# Patient Record
Sex: Female | Born: 1945 | Race: White | Hispanic: No | Smoking: Current every day smoker
Health system: Southern US, Community
[De-identification: ages and names within clinical notes are randomized; demographics above are authoritative.]

## PROBLEM LIST (undated history)

## (undated) DIAGNOSIS — E7800 Pure hypercholesterolemia, unspecified: Secondary | ICD-10-CM

## (undated) DIAGNOSIS — R51 Headache: Secondary | ICD-10-CM

## (undated) DIAGNOSIS — F17200 Nicotine dependence, unspecified, uncomplicated: Secondary | ICD-10-CM

## (undated) DIAGNOSIS — J309 Allergic rhinitis, unspecified: Secondary | ICD-10-CM

## (undated) DIAGNOSIS — G43909 Migraine, unspecified, not intractable, without status migrainosus: Secondary | ICD-10-CM

## (undated) DIAGNOSIS — F1910 Other psychoactive substance abuse, uncomplicated: Secondary | ICD-10-CM

## (undated) DIAGNOSIS — J189 Pneumonia, unspecified organism: Secondary | ICD-10-CM

## (undated) DIAGNOSIS — K259 Gastric ulcer, unspecified as acute or chronic, without hemorrhage or perforation: Secondary | ICD-10-CM

## (undated) DIAGNOSIS — I059 Rheumatic mitral valve disease, unspecified: Secondary | ICD-10-CM

## (undated) DIAGNOSIS — C801 Malignant (primary) neoplasm, unspecified: Secondary | ICD-10-CM

## (undated) DIAGNOSIS — M1710 Unilateral primary osteoarthritis, unspecified knee: Secondary | ICD-10-CM

## (undated) DIAGNOSIS — R011 Cardiac murmur, unspecified: Secondary | ICD-10-CM

## (undated) DIAGNOSIS — F32A Depression, unspecified: Secondary | ICD-10-CM

## (undated) DIAGNOSIS — D239 Other benign neoplasm of skin, unspecified: Secondary | ICD-10-CM

## (undated) DIAGNOSIS — R7309 Other abnormal glucose: Secondary | ICD-10-CM

## (undated) DIAGNOSIS — C679 Malignant neoplasm of bladder, unspecified: Secondary | ICD-10-CM

## (undated) DIAGNOSIS — IMO0002 Reserved for concepts with insufficient information to code with codable children: Secondary | ICD-10-CM

## (undated) DIAGNOSIS — F329 Major depressive disorder, single episode, unspecified: Secondary | ICD-10-CM

## (undated) DIAGNOSIS — I10 Essential (primary) hypertension: Secondary | ICD-10-CM

## (undated) DIAGNOSIS — T7840XA Allergy, unspecified, initial encounter: Secondary | ICD-10-CM

## (undated) DIAGNOSIS — N951 Menopausal and female climacteric states: Secondary | ICD-10-CM

## (undated) HISTORY — DX: Rheumatic mitral valve disease, unspecified: I05.9

## (undated) HISTORY — DX: Reserved for concepts with insufficient information to code with codable children: IMO0002

## (undated) HISTORY — DX: Unilateral primary osteoarthritis, unspecified knee: M17.10

## (undated) HISTORY — DX: Allergic rhinitis, unspecified: J30.9

## (undated) HISTORY — DX: Major depressive disorder, single episode, unspecified: F32.9

## (undated) HISTORY — DX: Depression, unspecified: F32.A

## (undated) HISTORY — DX: Malignant (primary) neoplasm, unspecified: C80.1

## (undated) HISTORY — DX: Pure hypercholesterolemia, unspecified: E78.00

## (undated) HISTORY — DX: Menopausal and female climacteric states: N95.1

## (undated) HISTORY — DX: Other benign neoplasm of skin, unspecified: D23.9

## (undated) HISTORY — DX: Essential (primary) hypertension: I10

## (undated) HISTORY — DX: Other abnormal glucose: R73.09

## (undated) HISTORY — DX: Allergy, unspecified, initial encounter: T78.40XA

## (undated) HISTORY — DX: Gastric ulcer, unspecified as acute or chronic, without hemorrhage or perforation: K25.9

## (undated) HISTORY — DX: Headache: R51

## (undated) HISTORY — DX: Other psychoactive substance abuse, uncomplicated: F19.10

## (undated) HISTORY — DX: Nicotine dependence, unspecified, uncomplicated: F17.200

## (undated) HISTORY — DX: Migraine, unspecified, not intractable, without status migrainosus: G43.909

---
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
## 1968-11-02 HISTORY — PX: BREAST BIOPSY: SHX20

## 1996-11-02 HISTORY — PX: DILATION AND CURETTAGE OF UTERUS: SHX78

## 2007-10-13 ENCOUNTER — Ambulatory Visit: Payer: Self-pay | Admitting: Family Medicine

## 2007-10-19 ENCOUNTER — Ambulatory Visit: Payer: Self-pay | Admitting: Family Medicine

## 2008-08-20 ENCOUNTER — Emergency Department: Payer: Self-pay | Admitting: Emergency Medicine

## 2008-10-02 HISTORY — PX: CARDIAC CATHETERIZATION: SHX172

## 2010-07-15 ENCOUNTER — Ambulatory Visit: Payer: Self-pay | Admitting: Family Medicine

## 2011-01-08 ENCOUNTER — Ambulatory Visit: Payer: Self-pay | Admitting: Family Medicine

## 2011-07-13 ENCOUNTER — Ambulatory Visit: Payer: Self-pay | Admitting: Family Medicine

## 2011-07-14 ENCOUNTER — Ambulatory Visit: Payer: Self-pay | Admitting: Family Medicine

## 2011-07-14 LAB — HM MAMMOGRAPHY: HM Mammogram: NEGATIVE

## 2011-11-03 HISTORY — PX: OTHER SURGICAL HISTORY: SHX169

## 2012-08-19 ENCOUNTER — Encounter: Payer: Self-pay | Admitting: *Deleted

## 2012-08-23 ENCOUNTER — Encounter: Payer: Self-pay | Admitting: Family Medicine

## 2012-08-29 ENCOUNTER — Ambulatory Visit (INDEPENDENT_AMBULATORY_CARE_PROVIDER_SITE_OTHER): Payer: BC Managed Care – PPO | Admitting: Family Medicine

## 2012-08-29 ENCOUNTER — Encounter: Payer: Self-pay | Admitting: Family Medicine

## 2012-08-29 VITALS — BP 148/88 | HR 71 | Temp 98.4°F | Resp 16 | Ht 65.5 in | Wt 151.0 lb

## 2012-08-29 DIAGNOSIS — Z01419 Encounter for gynecological examination (general) (routine) without abnormal findings: Secondary | ICD-10-CM

## 2012-08-29 DIAGNOSIS — E7800 Pure hypercholesterolemia, unspecified: Secondary | ICD-10-CM

## 2012-08-29 DIAGNOSIS — R7309 Other abnormal glucose: Secondary | ICD-10-CM

## 2012-08-29 DIAGNOSIS — Z0000 Encounter for general adult medical examination without abnormal findings: Secondary | ICD-10-CM

## 2012-08-29 DIAGNOSIS — I10 Essential (primary) hypertension: Secondary | ICD-10-CM

## 2012-08-29 DIAGNOSIS — M169 Osteoarthritis of hip, unspecified: Secondary | ICD-10-CM

## 2012-08-29 LAB — POCT UA - MICROSCOPIC ONLY
Casts, Ur, LPF, POC: NEGATIVE
Yeast, UA: NEGATIVE

## 2012-08-29 LAB — POCT URINALYSIS DIPSTICK
Blood, UA: NEGATIVE
Glucose, UA: NEGATIVE
Nitrite, UA: NEGATIVE
Protein, UA: NEGATIVE
Urobilinogen, UA: 0.2

## 2012-08-29 NOTE — Progress Notes (Signed)
[address]   [place], [place]  [address]   [phone]  Subjective:    Patient ID: Kelly Merritt, female    DOB: 09-Jul-1946, 66 y.o.   MRN: [id number]  HPI This 66 y.o. female presents to establish care and for evaluation of CPE. Last physical 07/2011.  Pap smear 07/2011.  Mammogram 07/14/2011.  Colonoscopy never; refuses.  TDAP 2012.  Pneumovax 2010.  Zostavax never; refuses; history of Zoster.  Influenza vaccine never; refuses.  Eye exam 2010; glasses.  Dental exam every six months.      Review of Systems  Constitutional: Negative for fever, chills, diaphoresis, activity change, appetite change, fatigue and unexpected weight change.  HENT: Negative for hearing loss, ear pain, nosebleeds, congestion, sore throat, facial swelling, rhinorrhea, sneezing, drooling, mouth sores, trouble swallowing, neck pain, neck stiffness, dental problem, voice change, postnasal drip, sinus pressure, tinnitus and ear discharge.   Eyes: Negative for photophobia, pain, discharge, redness, itching and visual disturbance.  Respiratory: Negative for apnea, cough, choking, chest tightness, shortness of breath, wheezing and stridor.   Cardiovascular: Negative for chest pain, palpitations and leg swelling.  Gastrointestinal: Negative for nausea, vomiting, abdominal pain, diarrhea, constipation, blood in stool, abdominal distention, anal bleeding and rectal pain.  Genitourinary: Negative for dysuria, urgency, frequency, hematuria, flank pain, decreased urine volume, vaginal bleeding, vaginal discharge, enuresis, difficulty urinating, genital sores, vaginal pain, menstrual problem, pelvic pain and dyspareunia.  Musculoskeletal: Positive for arthralgias and gait problem. Negative for myalgias, back pain and joint swelling.  Skin: Negative for color change, pallor, rash and wound.  Neurological: Negative for dizziness, tremors, seizures, syncope, facial asymmetry, speech difficulty, weakness, light-headedness, numbness  and headaches.  Hematological: Negative for adenopathy. Does not bruise/bleed easily.  Psychiatric/Behavioral: Negative for suicidal ideas, hallucinations, behavioral problems, confusion, disturbed wake/sleep cycle, self-injury, dysphoric mood, decreased concentration and agitation. The patient is not nervous/anxious and is not hyperactive.         Past Medical History  Diagnosis Date  . Headache   . Migraine, unspecified, without mention of intractable migraine without mention of status migrainosus   . Gastric ulcer, unspecified as acute or chronic, without mention of hemorrhage, perforation, or obstruction   . Symptomatic menopausal or female climacteric states   . Tobacco use disorder   . Symptomatic menopausal or female climacteric states   . Mitral valve disorders   . Osteoarthrosis, unspecified whether generalized or localized, lower leg   . Pure hypercholesterolemia   . Benign neoplasm of other specified sites of skin   . Allergic rhinitis, cause unspecified   . Essential hypertension, benign   . Other abnormal glucose     Past Surgical History  Procedure Date  . Tonsillectomy and adenoidectomy 1950  . Breast biopsy 1970  . Dilation and curettage of uterus 1998    heavy menses  . Cardiac catheterization 10/2008  . Resection of squamous cell carcinoma forearm 2013    Prior to Admission medications   Medication Sig Start Date End Date Taking? Authorizing Provider  amLODipine (NORVASC) 5 MG tablet Take 5 mg by mouth daily.   Yes Historical Provider, MD  calcium carbonate (OS-CAL) 600 MG TABS Take 600 mg by mouth 2 (two) times daily with a meal.   Yes Historical Provider, MD  Coenzyme Q10 (COQ10 PO) Take by mouth daily.   Yes Historical Provider, MD  Glucosamine-Chondroitin 500-400 MG CAPS Take by mouth.   Yes Historical Provider, MD  ibuprofen (ADVIL,MOTRIN) 800 MG tablet Take 800 mg by mouth  every 8 (eight) hours as needed.   Yes Historical Provider, MD  lactobacillus  acidophilus (BACID) TABS Take by mouth.   Yes Historical Provider, MD  Multiple Vitamin (MULTIVITAMINS PO) Take by mouth daily.   Yes Historical Provider, MD  OMEGA 3 1000 MG CAPS Take 2 capsules by mouth daily.   Yes Historical Provider, MD  vitamin C (ASCORBIC ACID) 250 MG tablet Take 250 mg by mouth daily.   Yes Historical Provider, MD  vitamin E 400 UNIT capsule Take 400 Units by mouth daily.   Yes Historical Provider, MD  cholecalciferol (VITAMIN D) 400 UNITS TABS Take 400 Units by mouth daily.    Historical Provider, MD  Cholecalciferol 1000 UNITS capsule Take 1,000 Units by mouth daily.    Historical Provider, MD  Ginkgo Biloba (GINKOBA) 40 MG TABS Take by mouth daily.    Historical Provider, MD  metoprolol succinate (TOPROL-XL) 25 MG 24 hr tablet Take 25 mg by mouth daily.    Historical Provider, MD    Allergies  Allergen Reactions  . Indomethacin Other (See Comments)    SEVERE STOMACH CRAMPS AND BLOOD IN URINE    History   Social History  . Marital Status: Married    Spouse Name: N/A    Number of Children: 3  . Years of Education: college   Occupational History  . retired     Runner, broadcasting/film/video   Social History Main Topics  . Smoking status: Current Every Day Smoker -- 30 years    Types: Cigarettes  . Smokeless tobacco: Not on file   Comment: LESS THAN 1/2 PACK  . Alcohol Use: Yes     minimal  . Drug Use: No  . Sexually Active: Not on file   Other Topics Concern  . Not on file   Social History Narrative   Always uses seat belts. Smoke alarm and carbon monoxide detector in the home. Exercise: pt exercises fairly.Married x 39 years. Moderately happy, verbal abuse only at times, denies physical abuse, 3 children, 3 grandchildren.    Family History  Problem Relation Age of Onset  . Alcohol abuse Father   . Drug abuse Father   . Depression Father   . Cirrhosis Father   . Gout Father   . CAD Father     PTSD  . Heart disease Brother   . Dementia Mother   . Cancer  Sister     lung  . Hypertension Sister   . Hyperlipidemia Sister   . Leukemia Sister   . Heart disease Brother   . Hyperlipidemia Brother   . Hypertension Brother   . Ovarian cancer    . Breast cancer      Objective:   Physical Exam  Nursing note and vitals reviewed. Constitutional: She is oriented to person, place, and time. She appears well-developed and well-nourished. No distress.  HENT:  Head: Normocephalic and atraumatic.  Right Ear: External ear normal.  Left Ear: External ear normal.  Nose: Nose normal.  Mouth/Throat: Oropharynx is clear and moist.  Eyes: Conjunctivae normal and EOM are normal. Pupils are equal, round, and reactive to light.  Neck: Normal range of motion. Neck supple. No tracheal deviation present. No thyromegaly present.  Cardiovascular: Normal rate, regular rhythm, normal heart sounds and intact distal pulses.  Exam reveals no gallop and no friction rub.   No murmur heard. Pulmonary/Chest: Effort normal and breath sounds normal. No respiratory distress. She has no wheezes. She has no rales.  Abdominal: Soft. Bowel sounds  are normal. She exhibits no distension and no mass. There is no tenderness. There is no rebound and no guarding. Hernia confirmed negative in the right inguinal area and confirmed negative in the left inguinal area.  Genitourinary: Rectum normal, vagina normal and uterus normal. Rectal exam shows no fissure, no mass and anal tone normal. Guaiac negative stool. No breast swelling, tenderness, discharge or bleeding. There is no rash, tenderness or lesion on the right labia. There is no rash, tenderness or lesion on the left labia. Cervix exhibits no motion tenderness, no discharge and no friability. Right adnexum displays no mass, no tenderness and no fullness. Left adnexum displays no mass, no tenderness and no fullness. No vaginal discharge found.  Lymphadenopathy:    She has no cervical adenopathy.       Right: No inguinal adenopathy  present.       Left: No inguinal adenopathy present.  Neurological: She is alert and oriented to person, place, and time. She has normal reflexes. No cranial nerve deficit. She exhibits normal muscle tone. Coordination normal.  Skin: Skin is warm and dry. No rash noted. She is not diaphoretic. No erythema. No pallor.  Psychiatric: She has a normal mood and affect. Her behavior is normal. Judgment and thought content normal.   EKG:  NSR; mild inferior T wave changes.  No acute.    Assessment & Plan:   1. Pure hypercholesterolemia  Comprehensive metabolic panel, Lipid panel  2. Essential hypertension, benign  Comprehensive metabolic panel, Lipid panel, CK  3. Other abnormal glucose  CBC with Differential, Comprehensive metabolic panel, Hemoglobin A1c  4. Routine gynecological examination  MM Digital Screening, Pap IG w/ reflex to HPV when ASC-U  5. Routine general medical examination at a health care facility  POCT UA - Microscopic Only, POCT urinalysis dipstick, IFOBT POC (occult bld, rslt in office)

## 2012-08-30 ENCOUNTER — Encounter: Payer: Self-pay | Admitting: Family Medicine

## 2012-08-30 DIAGNOSIS — Z01419 Encounter for gynecological examination (general) (routine) without abnormal findings: Secondary | ICD-10-CM | POA: Insufficient documentation

## 2012-08-30 DIAGNOSIS — E7800 Pure hypercholesterolemia, unspecified: Secondary | ICD-10-CM | POA: Insufficient documentation

## 2012-08-30 DIAGNOSIS — R7309 Other abnormal glucose: Secondary | ICD-10-CM | POA: Insufficient documentation

## 2012-08-30 DIAGNOSIS — Z0000 Encounter for general adult medical examination without abnormal findings: Secondary | ICD-10-CM | POA: Insufficient documentation

## 2012-08-30 DIAGNOSIS — M169 Osteoarthritis of hip, unspecified: Secondary | ICD-10-CM | POA: Insufficient documentation

## 2012-08-30 DIAGNOSIS — I10 Essential (primary) hypertension: Secondary | ICD-10-CM | POA: Insufficient documentation

## 2012-08-30 LAB — COMPREHENSIVE METABOLIC PANEL
ALT: 18 U/L (ref 0–35)
AST: 27 U/L (ref 0–37)
Alkaline Phosphatase: 67 U/L (ref 39–117)
Sodium: 139 mEq/L (ref 135–145)
Total Bilirubin: 0.7 mg/dL (ref 0.3–1.2)
Total Protein: 6.8 g/dL (ref 6.0–8.3)

## 2012-08-30 LAB — CBC WITH DIFFERENTIAL/PLATELET
Basophils Relative: 1 % (ref 0–1)
Eosinophils Absolute: 0.1 10*3/uL (ref 0.0–0.7)
Lymphs Abs: 2.5 10*3/uL (ref 0.7–4.0)
MCH: 32.2 pg (ref 26.0–34.0)
MCHC: 35.7 g/dL (ref 30.0–36.0)
Neutro Abs: 6.8 10*3/uL (ref 1.7–7.7)
Neutrophils Relative %: 66 % (ref 43–77)
Platelets: 219 10*3/uL (ref 150–400)
RBC: 4.91 MIL/uL (ref 3.87–5.11)

## 2012-08-30 LAB — HEMOGLOBIN A1C
Hgb A1c MFr Bld: 5.5 % (ref <5.7)
Mean Plasma Glucose: 111 mg/dL (ref <117)

## 2012-08-30 LAB — LIPID PANEL
LDL Cholesterol: 205 mg/dL — ABNORMAL HIGH (ref 0–99)
VLDL: 20 mg/dL (ref 0–40)

## 2012-08-30 LAB — CK: Total CK: 127 U/L (ref 7–177)

## 2012-08-30 LAB — PAP IG W/ RFLX HPV ASCU

## 2012-08-30 NOTE — Assessment & Plan Note (Signed)
Anticipatory guidance --- weight loss, exercise, smoking cessation.  Pap smear obtained ;refer for mammogram.  Refuses colonoscopy; hemosure obtained.  Immunizations -- declined flu vaccine and Zostavax.  Obtain labs.

## 2012-08-30 NOTE — Assessment & Plan Note (Signed)
Pap smear obtained; refer for mammogram. 

## 2012-08-30 NOTE — Assessment & Plan Note (Signed)
Worsening; considering THR in upcoming six months; continue Ibuprofen qhs.  Having difficulties exercising due to hip pain.

## 2012-08-30 NOTE — Assessment & Plan Note (Signed)
Uncontrolled due to non-compliance with statins.  Obtain labs.

## 2012-08-30 NOTE — Assessment & Plan Note (Signed)
Moderately controlled with better home readings; no changes in management today; obtain labs.

## 2012-08-30 NOTE — Assessment & Plan Note (Signed)
Stable; obtain labs. 

## 2012-12-03 HISTORY — PX: JOINT REPLACEMENT: SHX530

## 2013-02-20 ENCOUNTER — Ambulatory Visit (INDEPENDENT_AMBULATORY_CARE_PROVIDER_SITE_OTHER): Payer: BC Managed Care – PPO | Admitting: Family Medicine

## 2013-02-20 ENCOUNTER — Encounter: Payer: Self-pay | Admitting: Family Medicine

## 2013-02-20 VITALS — BP 134/90 | HR 80 | Temp 97.7°F | Resp 16 | Ht 66.0 in | Wt 151.0 lb

## 2013-02-20 DIAGNOSIS — E7800 Pure hypercholesterolemia, unspecified: Secondary | ICD-10-CM

## 2013-02-20 DIAGNOSIS — M169 Osteoarthritis of hip, unspecified: Secondary | ICD-10-CM

## 2013-02-20 DIAGNOSIS — R7309 Other abnormal glucose: Secondary | ICD-10-CM

## 2013-02-20 DIAGNOSIS — H6120 Impacted cerumen, unspecified ear: Secondary | ICD-10-CM

## 2013-02-20 DIAGNOSIS — H6121 Impacted cerumen, right ear: Secondary | ICD-10-CM

## 2013-02-20 DIAGNOSIS — I10 Essential (primary) hypertension: Secondary | ICD-10-CM

## 2013-02-20 LAB — CBC WITH DIFFERENTIAL/PLATELET
Eosinophils Relative: 4 % (ref 0–5)
HCT: 43.9 % (ref 36.0–46.0)
Hemoglobin: 15.6 g/dL — ABNORMAL HIGH (ref 12.0–15.0)
Lymphocytes Relative: 26 % (ref 12–46)
Lymphs Abs: 1.5 10*3/uL (ref 0.7–4.0)
MCV: 91.3 fL (ref 78.0–100.0)
Monocytes Absolute: 0.6 10*3/uL (ref 0.1–1.0)
Monocytes Relative: 10 % (ref 3–12)
Neutro Abs: 3.5 10*3/uL (ref 1.7–7.7)
RBC: 4.81 MIL/uL (ref 3.87–5.11)
RDW: 13.9 % (ref 11.5–15.5)
WBC: 5.8 10*3/uL (ref 4.0–10.5)

## 2013-02-20 LAB — HEMOGLOBIN A1C: Hgb A1c MFr Bld: 5.3 % (ref <5.7)

## 2013-02-20 LAB — LIPID PANEL
Cholesterol: 286 mg/dL — ABNORMAL HIGH (ref 0–200)
LDL Cholesterol: 185 mg/dL — ABNORMAL HIGH (ref 0–99)
Triglycerides: 115 mg/dL (ref <150)

## 2013-02-20 LAB — COMPREHENSIVE METABOLIC PANEL
Albumin: 4.1 g/dL (ref 3.5–5.2)
BUN: 16 mg/dL (ref 6–23)
CO2: 26 mEq/L (ref 19–32)
Calcium: 9.6 mg/dL (ref 8.4–10.5)
Chloride: 106 mEq/L (ref 96–112)
Creat: 0.82 mg/dL (ref 0.50–1.10)
Potassium: 4.3 mEq/L (ref 3.5–5.3)

## 2013-02-20 MED ORDER — AMLODIPINE BESYLATE 5 MG PO TABS: 5.0000 mg | ORAL_TABLET | Freq: Every day | ORAL | Status: DC

## 2013-02-20 NOTE — Progress Notes (Signed)
[address]   [place], [place]  [address]   [phone]  Subjective:    Patient ID: Kelly Merritt, female    DOB: 1946-05-09, 67 y.o.   MRN: [id number]  HPI This 67 y.o. female presents for six month follow-up:  1.  L hip OA:  S/p THR in 12/2012.  S/p six week follow-up with surgeon; follow-up in one year.  Never had real hip pain; still a little numb in lateral thigh that extends to knee; numbness is fading.  Previous burning, tearing pain.  Taking Naproxen bid now.  Discharged with 100 oxycontin; suffered with severe nausea on medication; discharged quickly due to bad weather.  Vomited with pain medication.  Took Tylenol and ASA.   Having insomnia due to pain; took 1/2 oxycontin.   No PT warranted; performing home exercises prescribed.  Staying busy in yard and spring cleaning; very time consuming.  Son still lives with patient; son is traveling [employer] fundraiser.   2.  HTN:  Six month follow-up; no changes to management made at last visit.  Home BP readings running 130s/80-90s.  Reports good compliance with medication; good tolerance to medication; good symptom control.  Denies CP/palp/SOB/leg swelling. Denies HA, dizziness,focal weakness, paresthesias.  3.  Hyperlipidemia:  Uncontrolled; refuses to take statin due to arthralgias/myalgias. Interested in other treatment options.  No previous Zetia or Niacin.  No exercise in past three years.    4.  Glucose Intolerance:  Stable; has gained weight over past few years with less activity.  5.  R ear drainage/wax:  Suffering with drainage from R ear for several weeks; thinks it is wax draining. Using peroxide periodically.  No pain; no hearing loss.    Review of Systems  Constitutional: Negative for fever, chills, diaphoresis and fatigue.  HENT: Positive for ear discharge. Negative for hearing loss, ear pain, congestion, rhinorrhea, sneezing, postnasal drip and sinus pressure.   Respiratory: Negative for cough, shortness of breath, wheezing and stridor.    Cardiovascular: Negative for chest pain, palpitations and leg swelling.  Gastrointestinal: Negative for nausea, vomiting, abdominal pain, diarrhea and constipation.  Musculoskeletal: Positive for arthralgias.  Neurological: Negative for dizziness, tremors, seizures, syncope, facial asymmetry, speech difficulty, weakness, light-headedness, numbness and headaches.   Past Medical History  Diagnosis Date  . Headache   . Migraine, unspecified, without mention of intractable migraine without mention of status migrainosus   . Gastric ulcer, unspecified as acute or chronic, without mention of hemorrhage, perforation, or obstruction   . Symptomatic menopausal or female climacteric states   . Tobacco use disorder   . Symptomatic menopausal or female climacteric states   . Mitral valve disorders   . Pure hypercholesterolemia   . Benign neoplasm of other specified sites of skin   . Allergic rhinitis, cause unspecified   . Essential hypertension, benign   . Other abnormal glucose   . Osteoarthrosis, unspecified whether generalized or localized, lower leg     Severe OA hip.   Past Surgical History  Procedure Laterality Date  . Tonsillectomy and adenoidectomy  1950  . Breast biopsy  1970  . Dilation and curettage of uterus  1998    heavy menses  . Cardiac catheterization  10/2008  . Resection of squamous cell carcinoma forearm  2013  . Joint replacement  12/03/2012    L THR   Current Outpatient Prescriptions on File Prior to Visit  Medication Sig Dispense Refill  . cholecalciferol (VITAMIN D) 400 UNITS TABS Take 400 Units by mouth  daily.      . Glucosamine-Chondroitin 500-400 MG CAPS Take by mouth.      . lactobacillus acidophilus (BACID) TABS Take by mouth.      . Multiple Vitamin (MULTIVITAMINS PO) Take by mouth daily.      . OMEGA 3 1000 MG CAPS Take 2 capsules by mouth daily.      . vitamin C (ASCORBIC ACID) 250 MG tablet Take 250 mg by mouth daily.      . vitamin E 400 UNIT capsule  Take 400 Units by mouth daily.      . calcium carbonate (OS-CAL) 600 MG TABS Take 600 mg by mouth 2 (two) times daily with a meal.      . Coenzyme Q10 (COQ10 PO) Take by mouth daily.      Marland Kitchen ibuprofen (ADVIL,MOTRIN) 800 MG tablet Take 800 mg by mouth every 8 (eight) hours as needed.       No current facility-administered medications on file prior to visit.   History   Social History  . Marital Status: Married    Spouse Name: N/A    Number of Children: 3  . Years of Education: college   Occupational History  . retired     Runner, broadcasting/film/video   Social History Main Topics  . Smoking status: Current Every Day Smoker -- 30 years    Types: Cigarettes  . Smokeless tobacco: Never Used     Comment: LESS THAN 1/2 PACK  . Alcohol Use: No     Comment: minimal  . Drug Use: No  . Sexually Active: Yes    Birth Control/ Protection: Post-menopausal   Other Topics Concern  . Not on file   Social History Narrative   Always uses seat belts.   Smoke alarm and carbon monoxide detector in the home.    Exercise: pt exercises minimally.   Married x 39 years. Moderately happy, verbal abuse only at times, denies physical abuse.   Children:   3 children, 3 grandchildren.  Employment: retired from school system.  Tobacco abuse:  1/2 ppd.   Alcohol: weekends.  Lives: with husband.                  Objective:   Physical Exam  Nursing note and vitals reviewed. Constitutional: She is oriented to person, place, and time. She appears well-developed and well-nourished. No distress.  HENT:  Head: Normocephalic and atraumatic.  Left Ear: External ear normal.  Nose: Nose normal.  R EAR: CERUMEN IN CANAL; TM NOT VISUALIZED.  Neck: Normal range of motion. Neck supple. No thyromegaly present.  Cardiovascular: Normal rate, regular rhythm, normal heart sounds and intact distal pulses.  Exam reveals no gallop.   No murmur heard. Pulmonary/Chest: Effort normal and breath sounds normal. No respiratory distress. She has no  wheezes. She has no rales.  Abdominal: Soft. Bowel sounds are normal. She exhibits no distension. There is no tenderness. There is no rebound and no guarding.  Musculoskeletal:  Well healed incision L hip.  Lymphadenopathy:    She has no cervical adenopathy.  Neurological: She is alert and oriented to person, place, and time. No cranial nerve deficit. She exhibits normal muscle tone. Coordination normal.  Skin: She is not diaphoretic.  Psychiatric: She has a normal mood and affect. Her behavior is normal. Judgment and thought content normal.    R EAR IRRIGATED DURING VISIT; NORMAL TM R VISUALIZED.    Assessment & Plan:  Essential hypertension, benign - Plan: CBC with Differential, Comprehensive  metabolic panel  Pure hypercholesterolemia - Plan: Lipid panel  Other abnormal glucose - Plan: CBC with Differential, Comprehensive metabolic panel, Hemoglobin A1c  OA (osteoarthritis) of hip  Cerumen impaction, right   1.  HTN: controlled; obtain labs; continue current medications. Refill provided. 2.  Hyperlipidemia:  Uncontrolled; pt desires trial of short acting Niacin 500mg  qhs fro next six months.  If cannot tolerate Niacin, to call for Zetia rx. 3.  Glucose Intolerance: improved; obtain labs.  Continue with dietary modification. 4.  OA Hip L:  Improved; s/p L THR. 5.  R cerumen impaction:  New.  S/p irrigation during visit.  Meds ordered this encounter  Medications  . OVER THE COUNTER MEDICATION    Sig: Naproxen 325 mg taking  . amLODipine (NORVASC) 5 MG tablet    Sig: Take 1 tablet (5 mg total) by mouth daily.    Dispense:  90 tablet    Refill:  3

## 2013-02-20 NOTE — Patient Instructions (Signed)
1.  START NIACIN IMMEDIATE RELEASE 500MG  ONE AT BEDTIME. 2.  TAKE ASPIRIN 81MG  ONE TABLET ONE HOUR BEFORE TAKING NIACIN.

## 2013-02-20 NOTE — Progress Notes (Deleted)
  Subjective:    Patient ID: Kelly Merritt, female    DOB: [birth year], 67 y.o.   MRN: [id number]  HPI    Review of Systems  Constitutional: Positive for fatigue.  HENT: Positive for ear discharge.   Respiratory: Negative.   Cardiovascular: Negative.   Gastrointestinal: Negative.   Endocrine: Negative.   Genitourinary: Negative.   Musculoskeletal: Negative.   Skin: Negative.   Allergic/Immunologic: Negative.   Neurological: Negative.   Hematological: Negative.   Psychiatric/Behavioral: Positive for confusion.       Objective:   Physical Exam        Assessment & Plan:

## 2013-03-06 ENCOUNTER — Encounter: Payer: Self-pay | Admitting: Family Medicine

## 2013-08-07 ENCOUNTER — Encounter: Payer: Self-pay | Admitting: Family Medicine

## 2013-08-07 ENCOUNTER — Ambulatory Visit (INDEPENDENT_AMBULATORY_CARE_PROVIDER_SITE_OTHER): Payer: Medicare Other | Admitting: Family Medicine

## 2013-08-07 VITALS — BP 128/84 | HR 72 | Resp 16 | Ht 66.0 in | Wt 153.0 lb

## 2013-08-07 DIAGNOSIS — Z720 Tobacco use: Secondary | ICD-10-CM | POA: Insufficient documentation

## 2013-08-07 DIAGNOSIS — M169 Osteoarthritis of hip, unspecified: Secondary | ICD-10-CM

## 2013-08-07 DIAGNOSIS — I10 Essential (primary) hypertension: Secondary | ICD-10-CM

## 2013-08-07 DIAGNOSIS — E7800 Pure hypercholesterolemia, unspecified: Secondary | ICD-10-CM

## 2013-08-07 DIAGNOSIS — Z0000 Encounter for general adult medical examination without abnormal findings: Secondary | ICD-10-CM

## 2013-08-07 DIAGNOSIS — Z01419 Encounter for gynecological examination (general) (routine) without abnormal findings: Secondary | ICD-10-CM

## 2013-08-07 DIAGNOSIS — Z139 Encounter for screening, unspecified: Secondary | ICD-10-CM

## 2013-08-07 DIAGNOSIS — R7309 Other abnormal glucose: Secondary | ICD-10-CM

## 2013-08-07 LAB — POCT URINALYSIS DIPSTICK
Bilirubin, UA: NEGATIVE
Glucose, UA: NEGATIVE
Leukocytes, UA: NEGATIVE
Nitrite, UA: NEGATIVE
Protein, UA: NEGATIVE
Spec Grav, UA: 1.025
Urobilinogen, UA: 0.2
pH, UA: 5.5

## 2013-08-07 LAB — COMPREHENSIVE METABOLIC PANEL
ALT: 15 U/L (ref 0–35)
AST: 22 U/L (ref 0–37)
Albumin: 4.4 g/dL (ref 3.5–5.2)
Alkaline Phosphatase: 71 U/L (ref 39–117)
CO2: 29 mEq/L (ref 19–32)
Glucose, Bld: 99 mg/dL (ref 70–99)
Potassium: 4.1 mEq/L (ref 3.5–5.3)
Sodium: 137 mEq/L (ref 135–145)
Total Bilirubin: 0.7 mg/dL (ref 0.3–1.2)
Total Protein: 6.7 g/dL (ref 6.0–8.3)

## 2013-08-07 LAB — LIPID PANEL
Cholesterol: 326 mg/dL — ABNORMAL HIGH (ref 0–200)
LDL Cholesterol: 231 mg/dL — ABNORMAL HIGH (ref 0–99)
Total CHOL/HDL Ratio: 4.3 Ratio
Triglycerides: 95 mg/dL (ref <150)
VLDL: 19 mg/dL (ref 0–40)

## 2013-08-07 LAB — IFOBT (OCCULT BLOOD): IFOBT: NEGATIVE

## 2013-08-07 LAB — CBC WITH DIFFERENTIAL/PLATELET
Basophils Relative: 1 % (ref 0–1)
HCT: 45.4 % (ref 36.0–46.0)
Hemoglobin: 15.9 g/dL — ABNORMAL HIGH (ref 12.0–15.0)
Lymphs Abs: 1.7 10*3/uL (ref 0.7–4.0)
MCH: 31.9 pg (ref 26.0–34.0)
MCHC: 35 g/dL (ref 30.0–36.0)
Monocytes Absolute: 0.6 10*3/uL (ref 0.1–1.0)
Monocytes Relative: 10 % (ref 3–12)
Neutro Abs: 3.7 10*3/uL (ref 1.7–7.7)
Neutrophils Relative %: 60 % (ref 43–77)
RBC: 4.99 MIL/uL (ref 3.87–5.11)

## 2013-08-07 MED ORDER — EZETIMIBE 10 MG PO TABS: 10.0000 mg | ORAL_TABLET | Freq: Every day | ORAL | Status: DC

## 2013-08-07 MED ORDER — AMLODIPINE BESYLATE 5 MG PO TABS: 5.0000 mg | ORAL_TABLET | Freq: Every day | ORAL | Status: DC

## 2013-08-07 NOTE — Assessment & Plan Note (Signed)
Stable; obtain labs.  Encourage dietary modification, weight loss, exercise.

## 2013-08-07 NOTE — Assessment & Plan Note (Signed)
Controlled; obtain EKG, u/a; refill provided.

## 2013-08-07 NOTE — Assessment & Plan Note (Signed)
Anticipatory guidance --- smoking cessation, exercise. Pap smear obtained; refer for mammogram.  Hemosure negative; refuses colonoscopy.  Immunizations reviewed; refuses flu vaccine and Zostavax.  Declined bone density scan.  Independent with ADLs; no evidence of depression.  No hearing loss.  Low fall risk.  FULL CODE.

## 2013-08-07 NOTE — Assessment & Plan Note (Signed)
Uncontrolled; obtain labs; rx for Zetia provided; reviewed Zetia in detail.

## 2013-08-07 NOTE — Assessment & Plan Note (Signed)
Pre-contemplative.  Encourage cessation.

## 2013-08-07 NOTE — Assessment & Plan Note (Signed)
Worsening hip pain; encourage follow-up with ortho.

## 2013-08-07 NOTE — Progress Notes (Signed)
[address]   [place], [place]  [address]   [phone]  Subjective:    Patient ID: Kelly Merritt, female    DOB: 1946/06/16, 67 y.o.   MRN: [id number]  HPI This 67 y.o. female presents for CPE.  Last CPE and PAP- 10/13 Mammo-10/13 Dexa- not sure of date, not interested in having repeat at this time. Colonoscopy-never, refuses.  Agreeable to yearly hemosures. Flu- refuses. Shingles vaccine- refuses Tdap- 2012 Pneumonia vaccine- 2/10 Dental- current, seeing periodontist tomorrow. Eye- hasn't been in awhile; +readers.  1. OA L hip:  "Doing pretty good." Having some difficulty with left leg from hip to knee. Has not returned to ortho. Afraid that problem is in knee; doesn't want to be told she needs her knee replaced. Tripped over son's dog and fell about 1 month ago.  Started exercising. After first session on treadmill, had a lot of join pain. Tried to go shorter/slower next session, still with pain.  2.  HTN:  Not checking BP at home. Compliance with medication.  3.  Hyperlipidemia:Tried niacin- felt like "fire ants under my skin." Had itching. Tried to take in the morning, had elevated heart rate. Willing to try Zetia.  Review of Systems  Constitutional: Negative for fever, chills, diaphoresis, activity change, appetite change, fatigue and unexpected weight change.  HENT: Negative for hearing loss, ear pain, nosebleeds, congestion, sore throat, facial swelling, rhinorrhea, sneezing, drooling, mouth sores, trouble swallowing, neck pain, neck stiffness, dental problem, voice change, postnasal drip, sinus pressure, tinnitus and ear discharge.   Eyes: Positive for visual disturbance. Negative for photophobia, pain, discharge, redness and itching.  Respiratory: Positive for cough. Negative for choking, chest tightness, shortness of breath, wheezing and stridor.   Cardiovascular: Negative for chest pain, palpitations and leg swelling.  Gastrointestinal: Positive for constipation.  Negative for nausea, vomiting, abdominal pain, diarrhea, blood in stool, abdominal distention, anal bleeding and rectal pain.       Has noticed constipation with decreased activity and increased gas.  Endocrine: Negative for cold intolerance, heat intolerance, polydipsia, polyphagia and polyuria.  Genitourinary: Negative for dysuria, urgency, frequency, hematuria, flank pain, decreased urine volume, vaginal bleeding, vaginal discharge, difficulty urinating, genital sores, vaginal pain and pelvic pain.  Musculoskeletal: Positive for arthralgias and gait problem. Negative for myalgias, back pain and joint swelling.  Skin: Negative for color change, pallor, rash and wound.  Neurological: Positive for dizziness and numbness. Negative for tremors, seizures, syncope, facial asymmetry, speech difficulty, weakness, light-headedness and headaches.       Occasional dizzy spells. Last one 3 weeks ago. Numbness in left 4,5th fingers.  Hematological: Negative for adenopathy. Does not bruise/bleed easily.  Psychiatric/Behavioral: Negative for suicidal ideas, hallucinations, behavioral problems, confusion, sleep disturbance, self-injury, dysphoric mood, decreased concentration and agitation. The patient is not nervous/anxious and is not hyperactive.    Nocturia 1-2, occasional urge incontinence.   Sleeps 7-8 hours per night. Sometimes has difficulty falling asleep.  Smoking 10-12 cigarettes a day. Husband smokes 2ppd; makes it difficult to cut down. Husband with PTSD.   Emotionally having ups and downs. Discouraged with inability to exercise and leg pain.  Feels like she is not busy enough. Has mostly "ok" days. "Doing fine." Past Medical History  Diagnosis Date  . Headache(784.0)   . Migraine, unspecified, without mention of intractable migraine without mention of status migrainosus   . Gastric ulcer, unspecified as acute or chronic, without mention of hemorrhage, perforation, or obstruction   .  Symptomatic menopausal or female  climacteric states   . Tobacco use disorder   . Symptomatic menopausal or female climacteric states   . Mitral valve disorders   . Pure hypercholesterolemia   . Benign neoplasm of other specified sites of skin   . Allergic rhinitis, cause unspecified   . Essential hypertension, benign   . Other abnormal glucose   . Osteoarthrosis, unspecified whether generalized or localized, lower leg     Severe OA hip.   Past Surgical History  Procedure Laterality Date  . Tonsillectomy and adenoidectomy  1950  . Breast biopsy  1970  . Dilation and curettage of uterus  1998    heavy menses  . Cardiac catheterization  10/2008  . Resection of squamous cell carcinoma forearm  2013  . Joint replacement  12/03/2012    L THR   Allergies  Allergen Reactions  . Indomethacin Other (See Comments)    SEVERE STOMACH CRAMPS AND BLOOD IN URINE  . Niacin And Related    Current Outpatient Prescriptions on File Prior to Visit  Medication Sig Dispense Refill  . calcium carbonate (OS-CAL) 600 MG TABS Take 600 mg by mouth 2 (two) times daily with a meal.      . cholecalciferol (VITAMIN D) 400 UNITS TABS Take 400 Units by mouth daily.      Marland Kitchen ibuprofen (ADVIL,MOTRIN) 800 MG tablet Take 800 mg by mouth every 8 (eight) hours as needed.      . lactobacillus acidophilus (BACID) TABS Take by mouth.      . Multiple Vitamin (MULTIVITAMINS PO) Take by mouth daily.      . OMEGA 3 1000 MG CAPS Take 2 capsules by mouth daily.      Marland Kitchen OVER THE COUNTER MEDICATION Naproxen 325 mg taking      . vitamin C (ASCORBIC ACID) 250 MG tablet Take 250 mg by mouth daily.      . vitamin E 400 UNIT capsule Take 400 Units by mouth daily.      . Coenzyme Q10 (COQ10 PO) Take by mouth daily.      . Glucosamine-Chondroitin 500-400 MG CAPS Take by mouth.       No current facility-administered medications on file prior to visit.   History   Social History  . Marital Status: Married    Spouse Name: N/A     Number of Children: 3  . Years of Education: college   Occupational History  . retired     Runner, broadcasting/film/video   Social History Main Topics  . Smoking status: Current Every Day Smoker -- 30 years    Types: Cigarettes  . Smokeless tobacco: Never Used     Comment: LESS THAN 1/2 PACK  . Alcohol Use: No     Comment: minimal  . Drug Use: No  . Sexual Activity: Yes    Birth Control/ Protection: Post-menopausal   Other Topics Concern  . Not on file   Social History Narrative      Marital status:  Married x 39 years. Moderately happy, verbal abuse only at times, denies physical abuse.         Children: 3 children, 3 grandchildren.        Lives: with husband.      Employment: retired from school system.        Tobacco abuse:  1/2 ppd.         Alcohol: weekends rarely.         Exercise: sporadic; limited due to L hip pain/OA.  Always uses seat belts.         Smoke alarm and carbon monoxide detector in the home.                 Family History  Problem Relation Age of Onset  . Alcohol abuse Father   . Drug abuse Father   . Depression Father   . Cirrhosis Father   . Gout Father   . CAD Father     PTSD  . Heart disease Brother   . Dementia Mother   . Cancer Sister     lung  . Hypertension Sister   . Hyperlipidemia Sister   . Leukemia Sister   . Heart disease Brother   . Hyperlipidemia Brother   . Hypertension Brother   . Ovarian cancer    . Breast cancer        Objective:   Physical Exam  Nursing note and vitals reviewed. Constitutional: She is oriented to person, place, and time. She appears well-developed and well-nourished. No distress.  HENT:  Head: Normocephalic and atraumatic.  Right Ear: External ear normal.  Left Ear: External ear normal.  Nose: Nose normal.  Mouth/Throat: Oropharynx is clear and moist.  Eyes: Conjunctivae and EOM are normal. Pupils are equal, round, and reactive to light.  Neck: Normal range of motion. Neck supple. No thyromegaly present.   Cardiovascular: Normal rate, regular rhythm, normal heart sounds and intact distal pulses.  Exam reveals no gallop and no friction rub.   No murmur heard. Pulmonary/Chest: Effort normal and breath sounds normal. She has no wheezes. She has no rales.  Abdominal: Soft. Bowel sounds are normal. She exhibits no distension and no mass. There is no tenderness. There is no rebound and no guarding.  Genitourinary: Rectum normal, vagina normal and uterus normal. No breast swelling, tenderness, discharge or bleeding. Pelvic exam was performed with patient supine. There is no rash, tenderness or lesion on the right labia. There is no rash, tenderness or lesion on the left labia. Cervix exhibits no motion tenderness, no discharge and no friability. Right adnexum displays no mass, no tenderness and no fullness. Left adnexum displays no mass, no tenderness and no fullness.  Lymphadenopathy:    She has no cervical adenopathy.  Neurological: She is alert and oriented to person, place, and time. She has normal reflexes. No cranial nerve deficit. She exhibits normal muscle tone. Coordination normal.  Skin: Skin is warm and dry. No rash noted. She is not diaphoretic. No erythema. No pallor.  Psychiatric: She has a normal mood and affect. Her behavior is normal. Judgment and thought content normal.   Results for orders placed in visit on 08/07/13  POCT URINALYSIS DIPSTICK      Result Value Range   Color, UA yellow     Clarity, UA clear     Glucose, UA neg     Bilirubin, UA neg     Ketones, UA neg     Spec Grav, UA 1.025     Blood, UA neg     pH, UA 5.5     Protein, UA neg     Urobilinogen, UA 0.2     Nitrite, UA neg     Leukocytes, UA Negative    IFOBT (OCCULT BLOOD)      Result Value Range   IFOBT Negative     EKG: NSR: inferior ST changes.     Assessment & Plan:  Annual physical exam - Plan: EKG 12-Lead, POCT Urinalysis Dipstick, IFOBT POC (  occult bld, rslt in office)  Essential hypertension,  benign - Plan: CBC with Differential, Comprehensive metabolic panel  Pure hypercholesterolemia - Plan: Lipid panel, ezetimibe (ZETIA) 10 MG tablet  Routine gynecological examination - Plan: MM Digital Screening, Pap IG (Image Guided)  OA (osteoarthritis) of hip  Other abnormal glucose  Routine general medical examination at a health care facility

## 2013-08-07 NOTE — Assessment & Plan Note (Signed)
Pap smear obtained; reviewed current pap smear guidelines; refer for mammogram.

## 2013-08-08 LAB — PAP IG (IMAGE GUIDED)

## 2013-09-06 ENCOUNTER — Ambulatory Visit: Payer: Self-pay | Admitting: Family Medicine

## 2013-10-20 ENCOUNTER — Encounter: Payer: Self-pay | Admitting: Family Medicine

## 2013-10-30 ENCOUNTER — Telehealth: Payer: Self-pay

## 2013-10-30 DIAGNOSIS — E7800 Pure hypercholesterolemia, unspecified: Secondary | ICD-10-CM

## 2013-10-30 NOTE — Telephone Encounter (Signed)
Cholesterol was extremely elevated at 326. She wants labs done to see if Zetia is working before she refills it, pended order please advise

## 2013-10-30 NOTE — Telephone Encounter (Signed)
Pt requesting cholesterol check before continuing such expensive med(zetia)  Best phone for pt is [phone]

## 2013-10-31 NOTE — Telephone Encounter (Signed)
Agreeable to checking labs; lab order placed; recommend patient present fasting in upcoming 1-2 weeks for lab visit.  Should fast for 8-12 hours prior to labs.

## 2013-10-31 NOTE — Telephone Encounter (Signed)
Called her to advise.  

## 2013-11-01 ENCOUNTER — Other Ambulatory Visit (INDEPENDENT_AMBULATORY_CARE_PROVIDER_SITE_OTHER): Payer: Medicare Other | Admitting: Radiology

## 2013-11-01 VITALS — BP 147/93 | HR 68 | Temp 97.7°F | Resp 16 | Ht 65.5 in | Wt 155.0 lb

## 2013-11-01 DIAGNOSIS — E7800 Pure hypercholesterolemia, unspecified: Secondary | ICD-10-CM

## 2013-11-01 LAB — COMPREHENSIVE METABOLIC PANEL
ALT: 17 U/L (ref 0–35)
AST: 23 U/L (ref 0–37)
Albumin: 4.2 g/dL (ref 3.5–5.2)
CO2: 28 mEq/L (ref 19–32)
Chloride: 103 mEq/L (ref 96–112)
Glucose, Bld: 85 mg/dL (ref 70–99)
Potassium: 4.2 mEq/L (ref 3.5–5.3)
Sodium: 139 mEq/L (ref 135–145)
Total Bilirubin: 0.6 mg/dL (ref 0.3–1.2)
Total Protein: 6.3 g/dL (ref 6.0–8.3)

## 2013-11-01 LAB — LIPID PANEL
HDL: 66 mg/dL (ref >39)
LDL Cholesterol: 132 mg/dL — ABNORMAL HIGH (ref 0–99)

## 2013-11-01 NOTE — Progress Notes (Signed)
Lads drawn and sent to [facility]: lipid, cmet

## 2013-11-01 NOTE — Progress Notes (Signed)
Patient here for labs only. 

## 2014-02-05 ENCOUNTER — Ambulatory Visit (INDEPENDENT_AMBULATORY_CARE_PROVIDER_SITE_OTHER): Payer: Medicare Other | Admitting: Family Medicine

## 2014-02-05 ENCOUNTER — Encounter: Payer: Self-pay | Admitting: Family Medicine

## 2014-02-05 VITALS — BP 130/82 | HR 64 | Temp 98.4°F | Resp 16 | Ht 65.5 in | Wt 153.4 lb

## 2014-02-05 DIAGNOSIS — Z0000 Encounter for general adult medical examination without abnormal findings: Secondary | ICD-10-CM

## 2014-02-05 DIAGNOSIS — M169 Osteoarthritis of hip, unspecified: Secondary | ICD-10-CM

## 2014-02-05 DIAGNOSIS — Z8589 Personal history of malignant neoplasm of other organs and systems: Secondary | ICD-10-CM | POA: Insufficient documentation

## 2014-02-05 DIAGNOSIS — M1610 Unilateral primary osteoarthritis, unspecified hip: Secondary | ICD-10-CM

## 2014-02-05 DIAGNOSIS — R7309 Other abnormal glucose: Secondary | ICD-10-CM

## 2014-02-05 DIAGNOSIS — Z720 Tobacco use: Secondary | ICD-10-CM

## 2014-02-05 DIAGNOSIS — E7800 Pure hypercholesterolemia, unspecified: Secondary | ICD-10-CM

## 2014-02-05 DIAGNOSIS — D049 Carcinoma in situ of skin, unspecified: Secondary | ICD-10-CM

## 2014-02-05 DIAGNOSIS — I10 Essential (primary) hypertension: Secondary | ICD-10-CM

## 2014-02-05 LAB — CBC WITH DIFFERENTIAL/PLATELET
BASOS PCT: 1 % (ref 0–1)
Basophils Absolute: 0.1 10*3/uL (ref 0.0–0.1)
Eosinophils Absolute: 0.1 10*3/uL (ref 0.0–0.7)
Eosinophils Relative: 1 % (ref 0–5)
HEMATOCRIT: 45.2 % (ref 36.0–46.0)
HEMOGLOBIN: 15.9 g/dL — AB (ref 12.0–15.0)
LYMPHS ABS: 2 10*3/uL (ref 0.7–4.0)
Lymphocytes Relative: 30 % (ref 12–46)
MCH: 31.8 pg (ref 26.0–34.0)
MCHC: 35.2 g/dL (ref 30.0–36.0)
MCV: 90.4 fL (ref 78.0–100.0)
MONO ABS: 0.7 10*3/uL (ref 0.1–1.0)
MONOS PCT: 10 % (ref 3–12)
NEUTROS ABS: 3.8 10*3/uL (ref 1.7–7.7)
Neutrophils Relative %: 58 % (ref 43–77)
Platelets: 195 10*3/uL (ref 150–400)
RBC: 5 MIL/uL (ref 3.87–5.11)
RDW: 13.4 % (ref 11.5–15.5)
WBC: 6.6 10*3/uL (ref 4.0–10.5)

## 2014-02-05 LAB — LIPID PANEL
Cholesterol: 253 mg/dL — ABNORMAL HIGH (ref 0–200)
HDL: 76 mg/dL (ref >39)
LDL CALC: 154 mg/dL — AB (ref 0–99)
Total CHOL/HDL Ratio: 3.3 Ratio
Triglycerides: 116 mg/dL (ref <150)
VLDL: 23 mg/dL (ref 0–40)

## 2014-02-05 LAB — COMPLETE METABOLIC PANEL WITH GFR
ALBUMIN: 4.4 g/dL (ref 3.5–5.2)
ALK PHOS: 66 U/L (ref 39–117)
ALT: 17 U/L (ref 0–35)
AST: 24 U/L (ref 0–37)
BUN: 11 mg/dL (ref 6–23)
CALCIUM: 9.3 mg/dL (ref 8.4–10.5)
CO2: 26 mEq/L (ref 19–32)
Chloride: 105 mEq/L (ref 96–112)
Creat: 0.81 mg/dL (ref 0.50–1.10)
GFR, EST AFRICAN AMERICAN: 88 mL/min
GFR, Est Non African American: 76 mL/min
Glucose, Bld: 95 mg/dL (ref 70–99)
Potassium: 4 mEq/L (ref 3.5–5.3)
Sodium: 140 mEq/L (ref 135–145)
Total Bilirubin: 0.9 mg/dL (ref 0.2–1.2)
Total Protein: 6.5 g/dL (ref 6.0–8.3)

## 2014-02-05 LAB — HEMOGLOBIN A1C
HEMOGLOBIN A1C: 5.6 % (ref <5.7)
Mean Plasma Glucose: 114 mg/dL (ref <117)

## 2014-02-05 MED ORDER — IBUPROFEN 800 MG PO TABS: 800.0000 mg | ORAL_TABLET | Freq: Three times a day (TID) | ORAL | Status: DC | PRN

## 2014-02-05 NOTE — Progress Notes (Signed)
Subjective:    Patient ID: Kelly Merritt, female    DOB: [birth year], 68 y.o.   MRN: [id number]  This chart was scribed for Wardell Honour, MD by Maree Erie, ED Scribe. The patient was seen in room 21. Patient's care was started at 9:05 AM.  Chief Complaint  Patient presents with  . Hypertension  . Hypercholesterolemia  . Medication Refill    Ibuprofen 800 mg    PCP: Reginia Forts, MD   HPI  Kelly Merritt is a 68 y.o. female who presents to office for a follow up from a visit on 08/07/13 for a CPE.   Pap Smear: 08/2013, normal Mammogram: 09/2013, normal Stool: 08/2013, negative for blood  1. Hypertension: She is taking Amlodipine 5 mg for high blood pressure. We made no changes at the last visit. She is still not checking her BP at home because she states that she doesn't ever feel as if it is regularly elevated. She cooks with salt but does not tend to use the shaker on her food after cooking.    2. Hyperlipidemia: She cannot tolerate statins so she was started on Zetia for hyperlipidemia. Her total cholesterol was 326, LDL was 231 and HDL was 76 at the visit for CPE on 08/07/13. She called in December wanting to check cholesterol on the Zetia, total cholesterol decreased to 225 and LDL 132. Her prescription cost her almost 180 dollars every three months but she states that she will continue to use it as long as it is working.   3. Swollen Finger Joints: She woke up this morning with swollen fingers and toes. She states that she ate some ham yesterday but denies eating much overall. She denies using significant amounts of salt in her food typically.  Denies CP/palp/SOB/orthopnea.   4: Back Pain: Her youngest granddaughter spent the night last night and so she slept differently. She believes this is what caused the pain this morning. This was a one night occurrence to have her stay in the bed with her and stay overnight.    5. Seasonal Allergies: She started getting  seasonal allergies in her forties. She denies any significant symptoms today but reports that they have been gradually worsening throughout the years. She was outside a lot yesterday.   6. OA Left Hip: She reports persistent numbness to the upper left thigh and states that the area is hypersensitive. However, overall she is doing better with it from the last visit.   7. Squamous Cell Carcinoma: She had a resection on her abdomen near her umbilicus recently. She had one resected on her right forearm prior to this one. She is seen yearly to check for spots.   8. Tobacco Abuse: She reports cough and shortness of breath that she attributes to her increase in smoking. She has been smoking more possibly due to increase in stress. She is trying to decrease though.   Patient Active Problem List   Diagnosis Date Noted  . Squamous cell carcinoma in situ of skin 02/05/2014  . Tobacco abuse 08/07/2013  . Routine general medical examination at a health care facility 08/30/2012  . Routine gynecological examination 08/30/2012  . Other abnormal glucose 08/30/2012  . Essential hypertension, benign 08/30/2012  . Pure hypercholesterolemia 08/30/2012  . OA (osteoarthritis) of hip 08/30/2012   Past Medical History  Diagnosis Date  . Headache(784.0)   . Migraine, unspecified, without mention of intractable migraine without mention of status migrainosus   . Gastric ulcer, unspecified as  acute or chronic, without mention of hemorrhage, perforation, or obstruction   . Tobacco use disorder   . Symptomatic menopausal or female climacteric states   . Mitral valve disorders   . Pure hypercholesterolemia   . Benign neoplasm of other specified sites of skin     Squamous Cell Carcinoma abdomen; [facility]  . Allergic rhinitis, cause unspecified   . Essential hypertension, benign   . Other abnormal glucose   . Osteoarthrosis, unspecified whether generalized or localized, lower leg     Severe OA hip.  .  Cancer     Squamous Cell Carcinoma; followed annually by [facility]  . Allergy    Past Surgical History  Procedure Laterality Date  . Tonsillectomy and adenoidectomy  1950  . Breast biopsy  1970  . Dilation and curettage of uterus  1998    heavy menses  . Cardiac catheterization  10/2008  . Resection of squamous cell carcinoma forearm  2013    abdomen 2015  . Joint replacement  12/03/2012    L THR   Allergies  Allergen Reactions  . Indomethacin Other (See Comments)    SEVERE STOMACH CRAMPS AND BLOOD IN URINE  . Lipitor [Atorvastatin] Other (See Comments)    myalgias  . Niacin And Related    Prior to Admission medications   Medication Sig Start Date End Date Taking? Authorizing Provider  amLODipine (NORVASC) 5 MG tablet Take 1 tablet (5 mg total) by mouth daily. 08/07/13   Wardell Honour, MD  calcium carbonate (OS-CAL) 600 MG TABS Take 600 mg by mouth 2 (two) times daily with a meal.    Historical Provider, MD  cholecalciferol (VITAMIN D) 400 UNITS TABS Take 400 Units by mouth daily.    Historical Provider, MD  ezetimibe (ZETIA) 10 MG tablet Take 1 tablet (10 mg total) by mouth daily. 08/07/13   Wardell Honour, MD  Glucosamine-Chondroitin 500-400 MG CAPS Take by mouth.    Historical Provider, MD  ibuprofen (ADVIL,MOTRIN) 800 MG tablet Take 800 mg by mouth every 8 (eight) hours as needed.    Historical Provider, MD  Multiple Vitamin (MULTIVITAMINS PO) Take by mouth daily.    Historical Provider, MD  OMEGA 3 1000 MG CAPS Take 2 capsules by mouth daily.    Historical Provider, MD  OVER THE COUNTER MEDICATION Naproxen 325 mg taking    Historical Provider, MD  vitamin C (ASCORBIC ACID) 250 MG tablet Take 250 mg by mouth daily.    Historical Provider, MD  vitamin E 400 UNIT capsule Take 400 Units by mouth daily.    Historical Provider, MD   History   Social History  . Marital Status: Married    Spouse Name: N/A    Number of Children: 3  . Years of Education: college    Occupational History  . retired     Pharmacist, hospital   Social History Main Topics  . Smoking status: Current Every Day Smoker -- 30 years    Types: Cigarettes  . Smokeless tobacco: Never Used     Comment: LESS THAN 1/2 PACK  . Alcohol Use: No     Comment: minimal  . Drug Use: No  . Sexual Activity: Yes    Birth Control/ Protection: Post-menopausal   Other Topics Concern  . Not on file   Social History Narrative      Marital status:  Married x 39 years. Moderately happy, verbal abuse only at times, denies physical abuse.  Children: 3 children, 3 grandchildren.        Lives: with husband.      Employment: retired from school system.        Tobacco abuse:  1/2-1 ppd.         Alcohol: weekends rarely.         Exercise: sporadic; limited due to L hip pain/OA.      Always uses seat belts.         Smoke alarm and carbon monoxide detector in the home.                     Review of Systems  Constitutional: Negative for fever and chills.  HENT: Negative for drooling.   Eyes: Negative for discharge.  Respiratory: Positive for cough and shortness of breath.   Cardiovascular: Negative for chest pain and leg swelling.  Gastrointestinal: Negative for nausea, vomiting and diarrhea.  Endocrine: Negative for polyuria.  Genitourinary: Negative for hematuria.  Musculoskeletal: Positive for back pain and joint swelling. Negative for gait problem.  Skin: Negative for rash.  Allergic/Immunologic: Negative for immunocompromised state.  Neurological: Positive for numbness. Negative for speech difficulty.  Hematological: Negative for adenopathy.  Psychiatric/Behavioral: Negative for confusion.       Objective:   Physical Exam  Nursing note and vitals reviewed. Constitutional: She is oriented to person, place, and time. She appears well-developed and well-nourished. No distress.  HENT:  Head: Normocephalic and atraumatic.  Mouth/Throat: Oropharynx is clear and moist. No  oropharyngeal exudate.  Eyes: EOM are normal.  Neck: Neck supple. No tracheal deviation present.  Cardiovascular: Normal rate, regular rhythm and normal heart sounds.  Exam reveals no gallop and no friction rub.   No murmur heard. Mild diffuse swelling B hands/fingers.  Pulmonary/Chest: Effort normal and breath sounds normal. No respiratory distress.  Abdominal: Soft. Bowel sounds are normal. She exhibits no distension. There is no tenderness. There is no rebound and no guarding.  Musculoskeletal: Normal range of motion.  Neurological: She is alert and oriented to person, place, and time.  Skin: Skin is warm and dry.  Psychiatric: She has a normal mood and affect. Her behavior is normal.       Assessment & Plan:  Essential hypertension, benign - Plan: CBC with Differential, COMPLETE METABOLIC PANEL WITH GFR  Pure hypercholesterolemia - Plan: Lipid panel  Other abnormal glucose - Plan: Hemoglobin A1c  Routine general medical examination at a health care facility  OA (osteoarthritis) of hip  Squamous cell carcinoma in situ of skin  Tobacco abuse  Meds ordered this encounter  Medications  . ibuprofen (ADVIL,MOTRIN) 800 MG tablet    Sig: Take 1 tablet (800 mg total) by mouth every 8 (eight) hours as needed.    Dispense:  60 tablet    Refill:  3   1.  HTN: controlled; obtain labs; continue current medications. 2.  Hyperlipidemia: moderately controlled with Zetia; intolerant to statins; obtain labs. 3.  Glucose intolerance: stable; obtain labs.  Continue with dietary modification. 4.  OA L hip: stable; s/p hip replacement; refill of Ibuprofen 800mg  prn. 5.  Squamous Cell Carcinoma Abdomen: New.  S/p resection last week by [facility].  Second skin cancer in past five years. 6.  Tobacco abuse: increased intake.  Encourage cessation. 7.  Swelling B hand:  New. Onset this morning following Easter holiday; likely secondary high sodium intake over the weekend; pt unable to provide urine  sample; obtain labs; recommend low-sodium intake for the next  several days; no other associated symptoms.  I personally performed the services described in this documentation, which was scribed in my presence.  The recorded information has been reviewed and is accurate.  Reginia Forts, M.D.  [facility] [address] [place],   [address] [phone] phone [phone] fax

## 2014-02-07 ENCOUNTER — Encounter: Payer: Self-pay | Admitting: Family Medicine

## 2014-08-08 ENCOUNTER — Ambulatory Visit (INDEPENDENT_AMBULATORY_CARE_PROVIDER_SITE_OTHER): Payer: Medicare Other | Admitting: Family Medicine

## 2014-08-08 ENCOUNTER — Encounter: Payer: Self-pay | Admitting: Family Medicine

## 2014-08-08 VITALS — BP 130/84 | HR 68 | Temp 98.0°F | Resp 16 | Ht 66.0 in | Wt 155.8 lb

## 2014-08-08 DIAGNOSIS — E7800 Pure hypercholesterolemia, unspecified: Secondary | ICD-10-CM

## 2014-08-08 DIAGNOSIS — Z01419 Encounter for gynecological examination (general) (routine) without abnormal findings: Secondary | ICD-10-CM

## 2014-08-08 DIAGNOSIS — I10 Essential (primary) hypertension: Secondary | ICD-10-CM

## 2014-08-08 DIAGNOSIS — Z0000 Encounter for general adult medical examination without abnormal findings: Secondary | ICD-10-CM

## 2014-08-08 DIAGNOSIS — R0789 Other chest pain: Secondary | ICD-10-CM

## 2014-08-08 DIAGNOSIS — Z720 Tobacco use: Secondary | ICD-10-CM

## 2014-08-08 DIAGNOSIS — D049 Carcinoma in situ of skin, unspecified: Secondary | ICD-10-CM

## 2014-08-08 DIAGNOSIS — R7309 Other abnormal glucose: Secondary | ICD-10-CM

## 2014-08-08 DIAGNOSIS — Z1211 Encounter for screening for malignant neoplasm of colon: Secondary | ICD-10-CM

## 2014-08-08 LAB — POCT URINALYSIS DIPSTICK
Bilirubin, UA: NEGATIVE
GLUCOSE UA: NEGATIVE
Ketones, UA: NEGATIVE
LEUKOCYTES UA: NEGATIVE
NITRITE UA: NEGATIVE
Protein, UA: NEGATIVE
Spec Grav, UA: 1.015
UROBILINOGEN UA: 0.2
pH, UA: 5

## 2014-08-08 LAB — IFOBT (OCCULT BLOOD): IFOBT: NEGATIVE

## 2014-08-08 LAB — COMPLETE METABOLIC PANEL WITH GFR
ALBUMIN: 4.3 g/dL (ref 3.5–5.2)
ALT: 16 U/L (ref 0–35)
AST: 25 U/L (ref 0–37)
Alkaline Phosphatase: 72 U/L (ref 39–117)
BUN: 11 mg/dL (ref 6–23)
CO2: 29 mEq/L (ref 19–32)
Calcium: 9.4 mg/dL (ref 8.4–10.5)
Chloride: 103 mEq/L (ref 96–112)
Creat: 0.94 mg/dL (ref 0.50–1.10)
GFR, Est African American: 73 mL/min
GFR, Est Non African American: 63 mL/min
Glucose, Bld: 94 mg/dL (ref 70–99)
Potassium: 4.2 mEq/L (ref 3.5–5.3)
SODIUM: 140 meq/L (ref 135–145)
Total Bilirubin: 0.8 mg/dL (ref 0.2–1.2)
Total Protein: 6.8 g/dL (ref 6.0–8.3)

## 2014-08-08 LAB — CBC WITH DIFFERENTIAL/PLATELET
Basophils Absolute: 0.1 10*3/uL (ref 0.0–0.1)
Basophils Relative: 1 % (ref 0–1)
EOS ABS: 0.1 10*3/uL (ref 0.0–0.7)
Eosinophils Relative: 2 % (ref 0–5)
HCT: 47.5 % — ABNORMAL HIGH (ref 36.0–46.0)
HEMOGLOBIN: 16.3 g/dL — AB (ref 12.0–15.0)
Lymphocytes Relative: 31 % (ref 12–46)
Lymphs Abs: 2.1 10*3/uL (ref 0.7–4.0)
MCH: 31.7 pg (ref 26.0–34.0)
MCHC: 34.3 g/dL (ref 30.0–36.0)
MCV: 92.4 fL (ref 78.0–100.0)
MONOS PCT: 9 % (ref 3–12)
Monocytes Absolute: 0.6 10*3/uL (ref 0.1–1.0)
NEUTROS ABS: 3.8 10*3/uL (ref 1.7–7.7)
NEUTROS PCT: 57 % (ref 43–77)
PLATELETS: 226 10*3/uL (ref 150–400)
RBC: 5.14 MIL/uL — AB (ref 3.87–5.11)
RDW: 13.3 % (ref 11.5–15.5)
WBC: 6.7 10*3/uL (ref 4.0–10.5)

## 2014-08-08 LAB — LIPID PANEL
CHOLESTEROL: 262 mg/dL — AB (ref 0–200)
HDL: 84 mg/dL (ref >39)
LDL Cholesterol: 157 mg/dL — ABNORMAL HIGH (ref 0–99)
Total CHOL/HDL Ratio: 3.1 Ratio
Triglycerides: 106 mg/dL (ref <150)
VLDL: 21 mg/dL (ref 0–40)

## 2014-08-08 MED ORDER — AMLODIPINE BESYLATE 5 MG PO TABS: 5.0000 mg | ORAL_TABLET | Freq: Every day | ORAL | Status: DC

## 2014-08-08 MED ORDER — EZETIMIBE 10 MG PO TABS: 10.0000 mg | ORAL_TABLET | Freq: Every day | ORAL | Status: DC

## 2014-08-08 NOTE — Patient Instructions (Signed)

## 2014-08-08 NOTE — Progress Notes (Signed)
Subjective:    Patient ID: Kelly Merritt, female    DOB: 1946/03/26, 68 y.o.   MRN: [id number]  08/08/2014  Annual Exam, Hyperlipidemia and Hypertension   HPI This 68 y.o. female presents for [facility] Annual Wellness Examination Subsequent.  Last physical:  08/07/2013. Pap smear:  08/07/2013. WNL.  Pt requesting repeat pap today; has [employer] supplement. Mammogram:  09/06/2013 [facility]. WNL.   Colonoscopy:  Refuses; agreeable to yearly hemosures.  Asymptomatic. Bone density:  Declined last year 2014. Declines today. TDAP:  07/09/2011 Pneumovax:  06/20/2009 Zostavax: refused 2014. Refuses again today. Influenza: refused in 2014.  Refuses again today. Eye exam:  +readers.  2015.  +glasses for distance; driving at night.  Must have eyes dilated.    Dental exam:  Followed by periodontist.  Hypertension:  No changes to management made at last visit.  Patient reports good compliance with medication, good tolerance to medication, and good symptom control.  Not checking BP at home.  Denies CP/Palp/SOB/leg swelling.  Hyperlipidemia:  No changes to management made at last visit.  Patient reports good compliance with medication, good tolerance to medication, and good symptom control.  Denies HA/dizziness/focal weakness/paresthesias.  Stress reaction:  Daughter lost pregnancy at 4.5 months.  Lots of personal stressors; having good and bad days but overall coping.  Does not want to go into detail regarding stressors.   Chest pain: having chest pain at rest; no chest pain with walking or exertion. No radiation of chest pain into neck, L arm, back.  No associated diaphoresis, SOB, nausea.  Thinks it is GERD and stress related.  Usually occurs at rest.  Strong family history of early CAD; pt continues to smoke.  Just started walking again on the treadmill.   Review of Systems  Constitutional: Negative for fever, chills, diaphoresis, activity change, appetite change, fatigue and unexpected weight change.    HENT: Negative for congestion, dental problem, drooling, ear discharge, ear pain, facial swelling, hearing loss, mouth sores, nosebleeds, postnasal drip, rhinorrhea, sinus pressure, sneezing, sore throat, tinnitus, trouble swallowing and voice change.   Eyes: Negative for photophobia, pain, discharge, redness, itching and visual disturbance.  Respiratory: Negative for apnea, cough, choking, chest tightness, shortness of breath, wheezing and stridor.   Cardiovascular: Positive for chest pain. Negative for palpitations and leg swelling.  Gastrointestinal: Negative for nausea, vomiting, abdominal pain, diarrhea, constipation, blood in stool, abdominal distention, anal bleeding and rectal pain.  Endocrine: Negative for cold intolerance, heat intolerance, polydipsia, polyphagia and polyuria.  Genitourinary: Positive for urgency. Negative for dysuria, frequency, hematuria, flank pain, decreased urine volume, vaginal bleeding, vaginal discharge, enuresis, difficulty urinating, genital sores, vaginal pain, menstrual problem, pelvic pain and dyspareunia.  Musculoskeletal: Positive for arthralgias. Negative for back pain, gait problem, joint swelling, myalgias, neck pain and neck stiffness.  Skin: Negative for color change, pallor, rash and wound.  Allergic/Immunologic: Negative for environmental allergies, food allergies and immunocompromised state.  Neurological: Negative for dizziness, tremors, seizures, syncope, facial asymmetry, speech difficulty, weakness, light-headedness, numbness and headaches.  Hematological: Negative for adenopathy. Does not bruise/bleed easily.  Psychiatric/Behavioral: Negative for suicidal ideas, hallucinations, behavioral problems, confusion, sleep disturbance, self-injury, dysphoric mood, decreased concentration and agitation. The patient is not nervous/anxious and is not hyperactive.     Past Medical History  Diagnosis Date  . Headache(784.0)   . Migraine, unspecified,  without mention of intractable migraine without mention of status migrainosus   . Gastric ulcer, unspecified as acute or chronic, without mention of hemorrhage, perforation, or obstruction   .  Tobacco use disorder   . Symptomatic menopausal or female climacteric states   . Mitral valve disorders   . Pure hypercholesterolemia   . Benign neoplasm of other specified sites of skin     Squamous Cell Carcinoma abdomen; [facility]/derm  . Allergic rhinitis, cause unspecified   . Essential hypertension, benign   . Other abnormal glucose   . Osteoarthrosis, unspecified whether generalized or localized, lower leg     Severe OA hip.  . Cancer     Squamous Cell Carcinoma; followed annually by [facility]  . Allergy    Past Surgical History  Procedure Laterality Date  . Tonsillectomy and adenoidectomy  1950  . Dilation and curettage of uterus  1998    heavy menses  . Cardiac catheterization  10/2008  . Resection of squamous cell carcinoma forearm  2013    abdomen 2015  . Joint replacement  12/03/2012    L THR  . Breast biopsy  1970   Allergies  Allergen Reactions  . Indomethacin Other (See Comments)    SEVERE STOMACH CRAMPS AND BLOOD IN URINE  . Lipitor [Atorvastatin] Other (See Comments)    myalgias  . Niacin And Related    Current Outpatient Prescriptions  Medication Sig Dispense Refill  . amLODipine (NORVASC) 5 MG tablet Take 1 tablet (5 mg total) by mouth daily.  90 tablet  3  . cholecalciferol (VITAMIN D) 400 UNITS TABS Take 400 Units by mouth daily.      Marland Kitchen ezetimibe (ZETIA) 10 MG tablet Take 1 tablet (10 mg total) by mouth daily.  90 tablet  3  . Glucosamine-Chondroitin 500-400 MG CAPS Take by mouth.      Marland Kitchen ibuprofen (ADVIL,MOTRIN) 800 MG tablet Take 1 tablet (800 mg total) by mouth every 8 (eight) hours as needed.  60 tablet  3  . Multiple Vitamin (MULTIVITAMINS PO) Take by mouth daily.      . OMEGA 3 1000 MG CAPS Take 2 capsules by mouth daily.      . vitamin C  (ASCORBIC ACID) 250 MG tablet Take 250 mg by mouth daily.       No current facility-administered medications for this visit.       Objective:    BP 130/84  Pulse 68  Temp(Src) 98 F (36.7 C) (Oral)  Resp 16  Ht 5\' 6"  (1.676 m)  Wt 155 lb 12.8 oz (70.67 kg)  BMI 25.16 kg/m2  SpO2 96% Physical Exam  Nursing note and vitals reviewed. Constitutional: She is oriented to person, place, and time. She appears well-developed and well-nourished. No distress.  HENT:  Head: Normocephalic and atraumatic.  Right Ear: External ear normal.  Left Ear: External ear normal.  Nose: Nose normal.  Mouth/Throat: Oropharynx is clear and moist.  Eyes: Conjunctivae and EOM are normal. Pupils are equal, round, and reactive to light.  Neck: Normal range of motion and full passive range of motion without pain. Neck supple. No JVD present. Carotid bruit is not present. No thyromegaly present.  Cardiovascular: Normal rate, regular rhythm and normal heart sounds.  Exam reveals no gallop and no friction rub.   No murmur heard. Pulmonary/Chest: Effort normal and breath sounds normal. She has no wheezes. She has no rales. Right breast exhibits no inverted nipple, no mass, no nipple discharge, no skin change and no tenderness. Left breast exhibits no inverted nipple, no mass, no nipple discharge, no skin change and no tenderness. Breasts are symmetrical.  Abdominal: Soft. Bowel sounds are normal. She exhibits  no distension and no mass. There is no tenderness. There is no rebound and no guarding.  Genitourinary: Rectum normal, vagina normal and uterus normal. No breast swelling, tenderness, discharge or bleeding. Pelvic exam was performed with patient supine. There is no rash, tenderness or lesion on the right labia. There is no rash, tenderness or lesion on the left labia. Cervix exhibits no motion tenderness, no discharge and no friability. Right adnexum displays no mass, no tenderness and no fullness. Left adnexum  displays no mass, no tenderness and no fullness.  Musculoskeletal:       Right shoulder: Normal.       Left shoulder: Normal.       Cervical back: Normal.  Lymphadenopathy:    She has no cervical adenopathy.  Neurological: She is alert and oriented to person, place, and time. She has normal reflexes. No cranial nerve deficit. She exhibits normal muscle tone. Coordination normal.  Skin: Skin is warm and dry. No rash noted. She is not diaphoretic. No erythema. No pallor.  Psychiatric: She has a normal mood and affect. Her behavior is normal. Judgment and thought content normal.    EKG:NSR; no acute changes.      Assessment & Plan:   1. Encounter for Medicare annual wellness exam   2. Encounter for routine gynecological examination   3. Screening for colon cancer   4. Tobacco abuse   5. Squamous cell carcinoma in situ of skin   6. Pure hypercholesterolemia   7. Essential hypertension, benign   8. Blood glucose abnormal   9. Other chest pain     1. Annual Wellness Examination:  Anticipatory guidance --- exercise continued, smoking cessation.  Pap smear obtained per patient request.  Pt advised to schedule mammogram; card for [facility] provided.  Refuses colonoscopy; hemosure obtained in office.  Immunizations reviewed; pt declined Zostavax, Prevnar-13, influenza vaccines.  Independent with ADLs.   2. Gynecological exam: pap smear obtained per patient request; [facility] card provided for patient to schedule mammogram. 3. Colon cancer screening: pt refuses colonoscopy; hemosure obtained. 4.  HTN: controlled; obtain labs; refill provided; obtain u/a.  Follow-up six months. 5.  Hyperlipidemia: moderately controlled; intolerant to statins due to myalgias; continue Zetia; obtain FLP, CMET. 6.  Glucose intolerance: improved; obtain glucose, HgbAc. 7. Squamous cell carcinoma forearm: stable; followed by dermatology yearly. 8.  Chest pain: New. Atypical; stable EKG.   No  exertional component to pain; no associated symptoms. 9.   Tobacco abuse: precontemplative; encourage cessation.   Meds ordered this encounter  Medications  . ezetimibe (ZETIA) 10 MG tablet    Sig: Take 1 tablet (10 mg total) by mouth daily.    Dispense:  90 tablet    Refill:  3  . amLODipine (NORVASC) 5 MG tablet    Sig: Take 1 tablet (5 mg total) by mouth daily.    Dispense:  90 tablet    Refill:  3    No Follow-up on file.    Reginia Forts, M.D.  [facility] [address] [place], [place]  [address] [phone] phone [phone] fax

## 2014-08-09 LAB — HEMOGLOBIN A1C
Hgb A1c MFr Bld: 5.5 % (ref <5.7)
MEAN PLASMA GLUCOSE: 111 mg/dL (ref <117)

## 2014-08-09 LAB — PAP IG (IMAGE GUIDED)

## 2014-09-07 ENCOUNTER — Ambulatory Visit: Payer: Self-pay | Admitting: Family Medicine

## 2014-09-25 ENCOUNTER — Encounter: Payer: Self-pay | Admitting: *Deleted

## 2014-09-25 DIAGNOSIS — Z1231 Encounter for screening mammogram for malignant neoplasm of breast: Secondary | ICD-10-CM | POA: Insufficient documentation

## 2014-10-09 ENCOUNTER — Telehealth: Payer: Self-pay | Admitting: Family Medicine

## 2014-10-09 NOTE — Telephone Encounter (Signed)
Patient stated that Dr. Tamala Julian knows that she does not receive the flu shot.

## 2014-10-17 ENCOUNTER — Encounter: Payer: Self-pay | Admitting: Family Medicine

## 2015-02-06 ENCOUNTER — Ambulatory Visit (INDEPENDENT_AMBULATORY_CARE_PROVIDER_SITE_OTHER): Payer: Medicare Other | Admitting: Family Medicine

## 2015-02-06 ENCOUNTER — Encounter: Payer: Self-pay | Admitting: Family Medicine

## 2015-02-06 ENCOUNTER — Other Ambulatory Visit: Payer: Self-pay | Admitting: Family Medicine

## 2015-02-06 VITALS — BP 140/86 | HR 69 | Temp 97.2°F | Resp 16 | Ht 66.0 in | Wt 160.2 lb

## 2015-02-06 DIAGNOSIS — Z720 Tobacco use: Secondary | ICD-10-CM | POA: Diagnosis not present

## 2015-02-06 DIAGNOSIS — E785 Hyperlipidemia, unspecified: Secondary | ICD-10-CM

## 2015-02-06 DIAGNOSIS — I10 Essential (primary) hypertension: Secondary | ICD-10-CM | POA: Diagnosis not present

## 2015-02-06 DIAGNOSIS — M2550 Pain in unspecified joint: Secondary | ICD-10-CM

## 2015-02-06 DIAGNOSIS — F430 Acute stress reaction: Secondary | ICD-10-CM

## 2015-02-06 LAB — CBC WITH DIFFERENTIAL/PLATELET
Basophils Absolute: 0.1 10*3/uL (ref 0.0–0.1)
Basophils Relative: 1 % (ref 0–1)
Eosinophils Absolute: 0.1 10*3/uL (ref 0.0–0.7)
Eosinophils Relative: 2 % (ref 0–5)
HCT: 45.1 % (ref 36.0–46.0)
HEMOGLOBIN: 15.5 g/dL — AB (ref 12.0–15.0)
LYMPHS PCT: 32 % (ref 12–46)
Lymphs Abs: 2.2 10*3/uL (ref 0.7–4.0)
MCH: 31.3 pg (ref 26.0–34.0)
MCHC: 34.4 g/dL (ref 30.0–36.0)
MCV: 91.1 fL (ref 78.0–100.0)
MONO ABS: 0.6 10*3/uL (ref 0.1–1.0)
MONOS PCT: 9 % (ref 3–12)
MPV: 9.6 fL (ref 8.6–12.4)
NEUTROS ABS: 3.9 10*3/uL (ref 1.7–7.7)
Neutrophils Relative %: 56 % (ref 43–77)
Platelets: 217 10*3/uL (ref 150–400)
RBC: 4.95 MIL/uL (ref 3.87–5.11)
RDW: 12.9 % (ref 11.5–15.5)
WBC: 6.9 10*3/uL (ref 4.0–10.5)

## 2015-02-06 LAB — COMPREHENSIVE METABOLIC PANEL
ALK PHOS: 76 U/L (ref 39–117)
ALT: 20 U/L (ref 0–35)
AST: 27 U/L (ref 0–37)
Albumin: 4.4 g/dL (ref 3.5–5.2)
BUN: 17 mg/dL (ref 6–23)
CHLORIDE: 104 meq/L (ref 96–112)
CO2: 28 mEq/L (ref 19–32)
CREATININE: 0.94 mg/dL (ref 0.50–1.10)
Calcium: 9.8 mg/dL (ref 8.4–10.5)
Glucose, Bld: 93 mg/dL (ref 70–99)
POTASSIUM: 5.4 meq/L — AB (ref 3.5–5.3)
Sodium: 140 mEq/L (ref 135–145)
Total Bilirubin: 0.6 mg/dL (ref 0.2–1.2)
Total Protein: 6.8 g/dL (ref 6.0–8.3)

## 2015-02-06 LAB — LIPID PANEL
Cholesterol: 269 mg/dL — ABNORMAL HIGH (ref 0–200)
HDL: 98 mg/dL (ref >=46)
LDL Cholesterol: 155 mg/dL — ABNORMAL HIGH (ref 0–99)
Total CHOL/HDL Ratio: 2.7 Ratio
Triglycerides: 80 mg/dL (ref <150)
VLDL: 16 mg/dL (ref 0–40)

## 2015-02-06 NOTE — Patient Instructions (Signed)

## 2015-02-06 NOTE — Progress Notes (Signed)
Subjective:    Patient ID: Kelly Merritt, female    DOB: 12/11/1945, 69 y.o.   MRN: [id number]  02/06/2015  Follow-up; Hyperlipidemia; and Hypertension   HPI This 69 y.o. female presents for six month follow-up:  1. HTN:  Patient reports good compliance with medication, good tolerance to medication, and good symptom control.  Not checking Bp at home.    2.  Hyperlipidemia:  Patient reports good compliance with medication, poor tolerance to medication, and good symptom control.  Weight up five pounds.  Having joint aches with Zetia.  Intolerant to statins.  Interested in [place].    3. Tobacco abuse:  Last ciagarette in 12/2014.  Weight is up five pounds.  Switched to additive free cigarettes for one month; then quit.  Didn't want to smoke anymore.  Stresses trigger desire.  4. Acute stress reaction: husband suffered with GI bleeding after colonoscopy.  On top of that, husband had AMI from anemia.  Husband has been severely depressed.  Beginning of February, husband had chest pain; son took husband to ED; gallbladder dysfunction/pathology.  Placed drain four days later.  Unable to perform surgery due to anemia and bleeding risk.  Wife has been taking care of drain; two ED visit.  Drain still in place. Suffering with pain with saline infusions; had to replace tubing due to malfunction.  Tube also partially pulled out requiring replacement again.  Four hour meeting with anesthesia, surgeons; wearing a FitBit.  Surgery is 02-18-15.  No bridge needed prior to surgery.  Called this week and and advised needed bridge.  Taking 1/3 dose.  Daughter is pregnant.    5 Arthralgias:  Suffering with B ankle pain and B hand pain.     Review of Systems  Constitutional: Negative for fever, chills, diaphoresis and fatigue.  Eyes: Negative for visual disturbance.  Respiratory: Negative for cough and shortness of breath.   Cardiovascular: Negative for chest pain, palpitations and leg swelling.    Gastrointestinal: Negative for nausea, vomiting, abdominal pain, diarrhea and constipation.  Endocrine: Negative for cold intolerance, heat intolerance, polydipsia, polyphagia and polyuria.  Musculoskeletal: Positive for arthralgias.  Neurological: Negative for dizziness, tremors, seizures, syncope, facial asymmetry, speech difficulty, weakness, light-headedness, numbness and headaches.  Psychiatric/Behavioral: Positive for dysphoric mood. Negative for suicidal ideas, sleep disturbance and self-injury. The patient is not nervous/anxious.     Past Medical History  Diagnosis Date  . Headache(784.0)   . Migraine, unspecified, without mention of intractable migraine without mention of status migrainosus   . Gastric ulcer, unspecified as acute or chronic, without mention of hemorrhage, perforation, or obstruction   . Tobacco use disorder   . Symptomatic menopausal or female climacteric states   . Mitral valve disorders   . Pure hypercholesterolemia   . Benign neoplasm of other specified sites of skin     Squamous Cell Carcinoma abdomen; [facility]/derm  . Allergic rhinitis, cause unspecified   . Essential hypertension, benign   . Other abnormal glucose   . Osteoarthrosis, unspecified whether generalized or localized, lower leg     Severe OA hip.  . Cancer     Squamous Cell Carcinoma; followed annually by [facility]/[place]  . Allergy    Past Surgical History  Procedure Laterality Date  . Tonsillectomy and adenoidectomy  1950  . Dilation and curettage of uterus  1998    heavy menses  . Cardiac catheterization  10/2008  . Resection of squamous cell carcinoma forearm  2013    abdomen 2015  .  Joint replacement  12/03/2012    L THR  . Breast biopsy  1970   Allergies  Allergen Reactions  . Indomethacin Other (See Comments)    SEVERE STOMACH CRAMPS AND BLOOD IN URINE  . Lipitor [Atorvastatin] Other (See Comments)    myalgias  . Niacin And Related    Current Outpatient  Prescriptions  Medication Sig Dispense Refill  . amLODipine (NORVASC) 5 MG tablet Take 1 tablet (5 mg total) by mouth daily. 90 tablet 3  . cholecalciferol (VITAMIN D) 400 UNITS TABS Take 400 Units by mouth daily.    Marland Kitchen ezetimibe (ZETIA) 10 MG tablet Take 1 tablet (10 mg total) by mouth daily. (Patient not taking: Reported on 02/06/2015) 90 tablet 3  . Glucosamine-Chondroitin 500-400 MG CAPS Take by mouth.    Marland Kitchen ibuprofen (ADVIL,MOTRIN) 800 MG tablet Take 1 tablet (800 mg total) by mouth every 8 (eight) hours as needed. 60 tablet 3  . Multiple Vitamin (MULTIVITAMINS PO) Take by mouth daily.    . OMEGA 3 1000 MG CAPS Take 2 capsules by mouth daily.    . vitamin C (ASCORBIC ACID) 250 MG tablet Take 250 mg by mouth daily.     No current facility-administered medications for this visit.       Objective:    BP 140/86 mmHg  Pulse 69  Temp(Src) 97.2 F (36.2 C) (Oral)  Resp 16  Ht 5' 6"  (1.676 m)  Wt 160 lb 3.2 oz (72.666 kg)  BMI 25.87 kg/m2  SpO2 97% Physical Exam  Constitutional: She is oriented to person, place, and time. She appears well-developed and well-nourished. No distress.  HENT:  Head: Normocephalic and atraumatic.  Right Ear: External ear normal.  Left Ear: External ear normal.  Nose: Nose normal.  Mouth/Throat: Oropharynx is clear and moist.  Eyes: Conjunctivae and EOM are normal. Pupils are equal, round, and reactive to light.  Neck: Normal range of motion. Neck supple. Carotid bruit is not present. No thyromegaly present.  Cardiovascular: Normal rate, regular rhythm, normal heart sounds and intact distal pulses.  Exam reveals no gallop and no friction rub.   No murmur heard. Pulmonary/Chest: Effort normal and breath sounds normal. She has no wheezes. She has no rales.  Abdominal: Soft. Bowel sounds are normal. She exhibits no distension and no mass. There is no tenderness. There is no rebound and no guarding.  Lymphadenopathy:    She has no cervical adenopathy.   Neurological: She is alert and oriented to person, place, and time. No cranial nerve deficit.  Skin: Skin is warm and dry. No rash noted. She is not diaphoretic. No erythema. No pallor.  Psychiatric: She has a normal mood and affect. Her behavior is normal.        Assessment & Plan:   1. Essential hypertension, benign   2. Hyperlipidemia   3. Tobacco abuse   4. Arthralgia   5. Acute stress reaction     1. HTN: borderline control; no changes to management at this time; recent weight gain with smoking cessation. 2.  Hyperlipidemia: uncontrolled with Zetia; intolerant to statins.Obtain labs; plans to d/c Zetia and agreeable; desires trial of CoQ10. 3.  Tobacco abuse: congratulations on cessation! 4.  Arthralgias: worsening; B hands, ankles.  Discussed risk of daily NSAID use; obtain labs including ESR, RF, ANA.  Recommend Tylenol ES PRN.  Also discussed possibility of Meloxicam. 5. Family history of early CAD: high risk for cardiovascular disease; warrants aggressive control of lipids, hypertension.   6. Stress  reaction: new; counseling provided; pt coping well; declined medication or psychotherapy.   No orders of the defined types were placed in this encounter.    Return in about 6 months (around 08/08/2015) for complete physical examiniation.     Muhamed Luecke Elayne Guerin, M.D. [facility] [address] [place], [place]  [address] [phone] phone [phone] fax

## 2015-02-08 LAB — RHEUMATOID FACTOR

## 2015-02-11 LAB — ANA: Anti Nuclear Antibody(ANA): NEGATIVE

## 2015-04-29 ENCOUNTER — Other Ambulatory Visit: Payer: Self-pay

## 2015-07-02 ENCOUNTER — Other Ambulatory Visit: Payer: Self-pay | Admitting: Family Medicine

## 2015-08-12 ENCOUNTER — Encounter: Payer: Self-pay | Admitting: Family Medicine

## 2015-08-12 ENCOUNTER — Ambulatory Visit (INDEPENDENT_AMBULATORY_CARE_PROVIDER_SITE_OTHER): Payer: Medicare Other | Admitting: Family Medicine

## 2015-08-12 VITALS — BP 120/83 | HR 76 | Temp 98.3°F | Resp 16 | Ht 66.0 in | Wt 154.6 lb

## 2015-08-12 DIAGNOSIS — Z8249 Family history of ischemic heart disease and other diseases of the circulatory system: Secondary | ICD-10-CM | POA: Diagnosis not present

## 2015-08-12 DIAGNOSIS — R7309 Other abnormal glucose: Secondary | ICD-10-CM

## 2015-08-12 DIAGNOSIS — E7800 Pure hypercholesterolemia, unspecified: Secondary | ICD-10-CM | POA: Diagnosis not present

## 2015-08-12 DIAGNOSIS — Z0000 Encounter for general adult medical examination without abnormal findings: Secondary | ICD-10-CM

## 2015-08-12 DIAGNOSIS — Z1211 Encounter for screening for malignant neoplasm of colon: Secondary | ICD-10-CM | POA: Diagnosis not present

## 2015-08-12 DIAGNOSIS — Z1159 Encounter for screening for other viral diseases: Secondary | ICD-10-CM | POA: Diagnosis not present

## 2015-08-12 DIAGNOSIS — Z720 Tobacco use: Secondary | ICD-10-CM

## 2015-08-12 DIAGNOSIS — I10 Essential (primary) hypertension: Secondary | ICD-10-CM

## 2015-08-12 DIAGNOSIS — E785 Hyperlipidemia, unspecified: Secondary | ICD-10-CM

## 2015-08-12 DIAGNOSIS — D049 Carcinoma in situ of skin, unspecified: Secondary | ICD-10-CM

## 2015-08-12 LAB — POCT URINALYSIS DIP (MANUAL ENTRY)
BILIRUBIN UA: NEGATIVE
BILIRUBIN UA: NEGATIVE
Blood, UA: NEGATIVE
GLUCOSE UA: NEGATIVE
Leukocytes, UA: NEGATIVE
NITRITE UA: NEGATIVE
PH UA: 6
PROTEIN UA: NEGATIVE
Spec Grav, UA: 1.015
Urobilinogen, UA: 0.2

## 2015-08-12 LAB — COMPREHENSIVE METABOLIC PANEL
ALBUMIN: 4.7 g/dL (ref 3.6–5.1)
ALK PHOS: 71 U/L (ref 33–130)
ALT: 17 U/L (ref 6–29)
AST: 24 U/L (ref 10–35)
BILIRUBIN TOTAL: 1 mg/dL (ref 0.2–1.2)
BUN: 12 mg/dL (ref 7–25)
CALCIUM: 10 mg/dL (ref 8.6–10.4)
CO2: 30 mmol/L (ref 20–31)
Chloride: 102 mmol/L (ref 98–110)
Creat: 0.99 mg/dL (ref 0.50–0.99)
Glucose, Bld: 97 mg/dL (ref 65–99)
POTASSIUM: 4.5 mmol/L (ref 3.5–5.3)
Sodium: 138 mmol/L (ref 135–146)
TOTAL PROTEIN: 6.9 g/dL (ref 6.1–8.1)

## 2015-08-12 LAB — CBC WITH DIFFERENTIAL/PLATELET
BASOS PCT: 1 % (ref 0–1)
Basophils Absolute: 0.1 10*3/uL (ref 0.0–0.1)
EOS PCT: 1 % (ref 0–5)
Eosinophils Absolute: 0.1 10*3/uL (ref 0.0–0.7)
HEMATOCRIT: 47.1 % — AB (ref 36.0–46.0)
Hemoglobin: 16.4 g/dL — ABNORMAL HIGH (ref 12.0–15.0)
LYMPHS PCT: 27 % (ref 12–46)
Lymphs Abs: 2.6 10*3/uL (ref 0.7–4.0)
MCH: 32 pg (ref 26.0–34.0)
MCHC: 34.8 g/dL (ref 30.0–36.0)
MCV: 91.8 fL (ref 78.0–100.0)
MONO ABS: 0.8 10*3/uL (ref 0.1–1.0)
MPV: 9.5 fL (ref 8.6–12.4)
Monocytes Relative: 8 % (ref 3–12)
Neutro Abs: 6.2 10*3/uL (ref 1.7–7.7)
Neutrophils Relative %: 63 % (ref 43–77)
Platelets: 228 10*3/uL (ref 150–400)
RBC: 5.13 MIL/uL — ABNORMAL HIGH (ref 3.87–5.11)
RDW: 13.2 % (ref 11.5–15.5)
WBC: 9.8 10*3/uL (ref 4.0–10.5)

## 2015-08-12 LAB — IFOBT (OCCULT BLOOD): IMMUNOLOGICAL FECAL OCCULT BLOOD TEST: NEGATIVE

## 2015-08-12 LAB — LIPID PANEL
CHOLESTEROL: 309 mg/dL — AB (ref 125–200)
HDL: 75 mg/dL (ref >=46)
LDL Cholesterol: 210 mg/dL — ABNORMAL HIGH (ref <130)
TRIGLYCERIDES: 118 mg/dL (ref <150)
Total CHOL/HDL Ratio: 4.1 Ratio (ref <=5.0)
VLDL: 24 mg/dL (ref <30)

## 2015-08-12 LAB — HEPATITIS C ANTIBODY: HCV Ab: NEGATIVE

## 2015-08-12 MED ORDER — AMLODIPINE BESYLATE 5 MG PO TABS: 5.0000 mg | ORAL_TABLET | Freq: Every day | ORAL | Status: DC

## 2015-08-12 MED ORDER — IBUPROFEN 800 MG PO TABS: ORAL_TABLET | ORAL | Status: DC

## 2015-08-12 NOTE — Patient Instructions (Signed)

## 2015-08-12 NOTE — Progress Notes (Signed)
Subjective:    Patient ID: Kelly Merritt, female    DOB: 07/13/1946, 69 y.o.   MRN: [id number]  08/12/2015  Annual Exam   HPI This 69 y.o. female presents for Annual Wellness Examination.  Last physical:  08-08-2014 Pap smear:  08-08-2014  WNL Mammogram:  09-07-2014 Colonoscopy: refuses Bone density:  Several years ago; refuses TDAP:  2012 Pneumovax: 2010 Zostavax: never; history of shingles Influenza:  refuses Eye exam:  Last year; 2015; +glasses Dental exam:    HTN: Patient reports good compliance with medication, good tolerance to medication, and good symptom control.    Hyperlipidemia:  Intolerant to statins; refuses statins.    Glucose Intolerance:  Weight is down.  Review of Systems  Constitutional: Negative for fever, chills, diaphoresis, activity change, appetite change, fatigue and unexpected weight change.  HENT: Positive for postnasal drip. Negative for congestion, dental problem, drooling, ear discharge, ear pain, facial swelling, hearing loss, mouth sores, nosebleeds, rhinorrhea, sinus pressure, sneezing, sore throat, tinnitus, trouble swallowing and voice change.   Eyes: Negative for photophobia, pain, discharge, redness, itching and visual disturbance.  Respiratory: Positive for cough. Negative for apnea, choking, chest tightness, shortness of breath, wheezing and stridor.   Cardiovascular: Negative for chest pain, palpitations and leg swelling.  Gastrointestinal: Negative for nausea, vomiting, abdominal pain, diarrhea, constipation, blood in stool, abdominal distention, anal bleeding and rectal pain.  Endocrine: Negative for cold intolerance, heat intolerance, polydipsia, polyphagia and polyuria.  Genitourinary: Negative for dysuria, urgency, frequency, hematuria, flank pain, decreased urine volume, vaginal bleeding, vaginal discharge, enuresis, difficulty urinating, genital sores, vaginal pain, menstrual problem, pelvic pain and dyspareunia.  Musculoskeletal:  Positive for back pain and arthralgias. Negative for myalgias, joint swelling, gait problem, neck pain and neck stiffness.  Skin: Negative for color change, pallor, rash and wound.  Allergic/Immunologic: Negative for environmental allergies, food allergies and immunocompromised state.  Neurological: Negative for dizziness, tremors, seizures, syncope, facial asymmetry, speech difficulty, weakness, light-headedness, numbness and headaches.  Hematological: Negative for adenopathy. Does not bruise/bleed easily.  Psychiatric/Behavioral: Negative for suicidal ideas, hallucinations, behavioral problems, confusion, sleep disturbance, self-injury, dysphoric mood, decreased concentration and agitation. The patient is not nervous/anxious and is not hyperactive.     Past Medical History  Diagnosis Date  . Headache(784.0)   . Migraine, unspecified, without mention of intractable migraine without mention of status migrainosus   . Gastric ulcer, unspecified as acute or chronic, without mention of hemorrhage, perforation, or obstruction   . Tobacco use disorder   . Symptomatic menopausal or female climacteric states   . Mitral valve disorders   . Pure hypercholesterolemia   . Benign neoplasm of other specified sites of skin     Squamous Cell Carcinoma abdomen; [facility]/derm  . Allergic rhinitis, cause unspecified   . Essential hypertension, benign   . Other abnormal glucose   . Osteoarthrosis, unspecified whether generalized or localized, lower leg     Severe OA hip.  . Cancer ([place])     Squamous Cell Carcinoma; followed annually by [facility]  . Allergy   . Substance abuse     CIGARETTES  . Depression    Past Surgical History  Procedure Laterality Date  . Tonsillectomy and adenoidectomy  1950  . Dilation and curettage of uterus  1998    heavy menses  . Cardiac catheterization  10/2008  . Resection of squamous cell carcinoma forearm  2013    abdomen 2015  . Joint replacement   12/03/2012    L THR  . Breast  biopsy  1970   Allergies  Allergen Reactions  . Indomethacin Other (See Comments)    SEVERE STOMACH CRAMPS AND BLOOD IN URINE  . Lipitor [Atorvastatin] Other (See Comments)    myalgias  . Niacin And Related    Current Outpatient Prescriptions  Medication Sig Dispense Refill  . Coenzyme Q10 (COQ-10 PO) Take by mouth daily.    . Glucosamine-Chondroitin 500-400 MG CAPS Take by mouth.    Marland Kitchen ibuprofen (ADVIL,MOTRIN) 800 MG tablet TAKE ONE TABLET BY MOUTH EVERY 8 HOURS AS NEEDED 60 tablet 1  . Multiple Vitamin (MULTIVITAMINS PO) Take by mouth daily.    . OMEGA 3 1000 MG CAPS Take 2 capsules by mouth daily.    . vitamin C (ASCORBIC ACID) 250 MG tablet Take 250 mg by mouth daily.    Marland Kitchen amLODipine (NORVASC) 5 MG tablet Take 1 tablet (5 mg total) by mouth daily. 90 tablet 3  . cholecalciferol (VITAMIN D) 400 UNITS TABS Take 400 Units by mouth daily.     No current facility-administered medications for this visit.   Social History   Social History  . Marital Status: Married    Spouse Name: N/A  . Number of Children: 3  . Years of Education: college   Occupational History  . retired     Pharmacist, hospital   Social History Main Topics  . Smoking status: Current Every Day Smoker -- 30 years    Types: Cigarettes    Start date: 11/02/1966    Last Attempt to Quit: 12/03/2014  . Smokeless tobacco: Never Used     Comment: LESS THAN 1/2 PACK-per patient RESTARTED 02/2015  . Alcohol Use: No     Comment: minimal  . Drug Use: No  . Sexual Activity: Yes    Birth Control/ Protection: Post-menopausal   Other Topics Concern  . Not on file   Social History Narrative      Marital status:  Married x 47 years. Moderately happy, verbal abuse only at times, denies physical abuse.         Children: 3 children, 7 grandchildren.        Lives: with husband, 30 yo son, 9 yo grandson.      Employment: retired from school system.  Taught school.        Tobacco abuse:  1/2-1 ppd.           Alcohol: weekends rarely.         Exercise: none      Always uses seat belts.         Smoke alarm and carbon monoxide detector in the home.                       Family History  Problem Relation Age of Onset  . Alcohol abuse Father   . Drug abuse Father   . Depression Father   . Cirrhosis Father   . Gout Father   . CAD Father     PTSD  . Heart disease Father 65    AMI/CABG  . Dementia Mother   . Hyperlipidemia Mother   . Hypertension Sister   . Hyperlipidemia Sister   . Leukemia Sister   . Hyperlipidemia Sister   . Hypertension Sister   . Heart disease Brother   . Hyperlipidemia Brother   . Hypertension Brother   . COPD Brother   . Ovarian cancer    . Breast cancer    . COPD Brother   . Cancer  Sister     BOWEL  . Hyperlipidemia Sister   . Hypertension Sister   . Hyperlipidemia Sister   . Hypertension Sister        Objective:    BP 120/83 mmHg  Pulse 76  Temp(Src) 98.3 F (36.8 C) (Oral)  Resp 16  Ht 5\' 6"  (1.676 m)  Wt 154 lb 9.6 oz (70.126 kg)  BMI 24.96 kg/m2 Physical Exam  Constitutional: She is oriented to person, place, and time. She appears well-developed and well-nourished. No distress.  HENT:  Head: Normocephalic and atraumatic.  Right Ear: External ear normal.  Left Ear: External ear normal.  Nose: Nose normal.  Mouth/Throat: Oropharynx is clear and moist.  Eyes: Conjunctivae and EOM are normal. Pupils are equal, round, and reactive to light.  Neck: Normal range of motion and full passive range of motion without pain. Neck supple. No JVD present. Carotid bruit is not present. No thyromegaly present.  Cardiovascular: Normal rate, regular rhythm and normal heart sounds.  Exam reveals no gallop and no friction rub.   No murmur heard. Pulmonary/Chest: Effort normal and breath sounds normal. She has no wheezes. She has no rales. Right breast exhibits no inverted nipple, no mass, no nipple discharge, no skin change and no tenderness. Left  breast exhibits no inverted nipple, no mass, no nipple discharge, no skin change and no tenderness. Breasts are symmetrical.  Abdominal: Soft. Bowel sounds are normal. She exhibits no distension and no mass. There is no tenderness. There is no rebound and no guarding.  Genitourinary: Rectum normal, vagina normal and uterus normal. There is no rash, tenderness or lesion on the right labia. There is no rash, tenderness or lesion on the left labia. Cervix exhibits no motion tenderness and no friability. Right adnexum displays no mass, no tenderness and no fullness. Left adnexum displays no mass, no tenderness and no fullness.  Musculoskeletal:       Right shoulder: Normal.       Left shoulder: Normal.       Cervical back: Normal.  Lymphadenopathy:    She has no cervical adenopathy.  Neurological: She is alert and oriented to person, place, and time. She has normal reflexes. No cranial nerve deficit. She exhibits normal muscle tone. Coordination normal.  Skin: Skin is warm and dry. No rash noted. She is not diaphoretic. No erythema. No pallor.  Psychiatric: She has a normal mood and affect. Her behavior is normal. Judgment and thought content normal.  Nursing note and vitals reviewed.       Assessment & Plan:   1. Encounter for Medicare annual wellness exam   2. Squamous cell carcinoma in situ of skin   3. Pure hypercholesterolemia   4. Other abnormal glucose   5. Essential hypertension, benign   6. Hyperlipidemia   7. Need for hepatitis C screening test   8. Colon cancer screening   9. Family history of coronary artery disease   10. Tobacco abuse     Orders Placed This Encounter  Procedures  . CBC with Differential/Platelet  . Comprehensive metabolic panel    Order Specific Question:  Has the patient fasted?    Answer:  Yes  . Hemoglobin A1c  . Lipid panel    Order Specific Question:  Has the patient fasted?    Answer:  Yes  . Hepatitis C antibody  . POCT urinalysis dipstick    . IFOBT POC (occult bld, rslt in office)   Meds ordered this encounter  Medications  .  Coenzyme Q10 (COQ-10 PO)    Sig: Take by mouth daily.  Marland Kitchen amLODipine (NORVASC) 5 MG tablet    Sig: Take 1 tablet (5 mg total) by mouth daily.    Dispense:  90 tablet    Refill:  3  . ibuprofen (ADVIL,MOTRIN) 800 MG tablet    Sig: TAKE ONE TABLET BY MOUTH EVERY 8 HOURS AS NEEDED    Dispense:  60 tablet    Refill:  1    Return in about 6 months (around 02/10/2016) for recheck high blood pressure.    Khloey Chern Elayne Guerin, M.D. [facility] [address] [place], [place]  [address] [phone] phone [phone] fax

## 2015-08-13 LAB — HEMOGLOBIN A1C
HEMOGLOBIN A1C: 5.5 % (ref <5.7)
MEAN PLASMA GLUCOSE: 111 mg/dL (ref <117)

## 2015-09-18 ENCOUNTER — Telehealth: Payer: Self-pay | Admitting: Family Medicine

## 2015-09-18 NOTE — Telephone Encounter (Signed)
Patient states that she is currently on a combination BP and cholesterol pill. She is requesting to be on a non-generic form of this "statin" (did not give the actual name) because she states that her system does better on this.   [phone]

## 2015-09-18 NOTE — Telephone Encounter (Signed)
Called pt and she wants Caduet.  She stated that she was on this a while ago and it should be in her chart.  I did not see it.  She wants the Brand Name only no generic.

## 2015-09-19 MED ORDER — AMLODIPINE-ATORVASTATIN 5-10 MG PO TABS: 1.0000 | ORAL_TABLET | Freq: Every day | ORAL | Status: DC

## 2015-09-19 NOTE — Telephone Encounter (Signed)
Rx sent.  Pt notified. 

## 2015-09-19 NOTE — Telephone Encounter (Signed)
Rx for Caduet sent to [facility].

## 2015-09-23 ENCOUNTER — Other Ambulatory Visit: Payer: Self-pay

## 2015-09-23 MED ORDER — CADUET 5-10 MG PO TABS: 1.0000 | ORAL_TABLET | Freq: Every day | ORAL | Status: DC

## 2015-09-23 NOTE — Telephone Encounter (Signed)
Rx needs to be re-sent as DAW 1, name brand only. done

## 2016-02-17 ENCOUNTER — Ambulatory Visit: Payer: Medicare Other | Admitting: Family Medicine

## 2016-03-03 ENCOUNTER — Encounter: Payer: Self-pay | Admitting: Family Medicine

## 2016-03-03 ENCOUNTER — Ambulatory Visit (INDEPENDENT_AMBULATORY_CARE_PROVIDER_SITE_OTHER): Payer: Medicare Other | Admitting: Family Medicine

## 2016-03-03 VITALS — BP 118/78 | HR 76 | Temp 98.0°F | Resp 16 | Ht 65.75 in | Wt 153.0 lb

## 2016-03-03 DIAGNOSIS — D049 Carcinoma in situ of skin, unspecified: Secondary | ICD-10-CM | POA: Diagnosis not present

## 2016-03-03 DIAGNOSIS — Z720 Tobacco use: Secondary | ICD-10-CM

## 2016-03-03 DIAGNOSIS — I10 Essential (primary) hypertension: Secondary | ICD-10-CM | POA: Diagnosis not present

## 2016-03-03 DIAGNOSIS — E7800 Pure hypercholesterolemia, unspecified: Secondary | ICD-10-CM

## 2016-03-03 DIAGNOSIS — M1612 Unilateral primary osteoarthritis, left hip: Secondary | ICD-10-CM

## 2016-03-03 DIAGNOSIS — Z8249 Family history of ischemic heart disease and other diseases of the circulatory system: Secondary | ICD-10-CM

## 2016-03-03 LAB — COMPREHENSIVE METABOLIC PANEL
ALBUMIN: 4.3 g/dL (ref 3.6–5.1)
ALT: 20 U/L (ref 6–29)
AST: 26 U/L (ref 10–35)
Alkaline Phosphatase: 66 U/L (ref 33–130)
BUN: 11 mg/dL (ref 7–25)
CALCIUM: 9.6 mg/dL (ref 8.6–10.4)
CHLORIDE: 105 mmol/L (ref 98–110)
CO2: 22 mmol/L (ref 20–31)
Creat: 0.96 mg/dL (ref 0.50–0.99)
GLUCOSE: 101 mg/dL — AB (ref 65–99)
POTASSIUM: 4.2 mmol/L (ref 3.5–5.3)
Sodium: 141 mmol/L (ref 135–146)
Total Bilirubin: 0.7 mg/dL (ref 0.2–1.2)
Total Protein: 6.7 g/dL (ref 6.1–8.1)

## 2016-03-03 LAB — CBC WITH DIFFERENTIAL/PLATELET
Basophils Absolute: 85 cells/uL (ref 0–200)
Basophils Relative: 1 %
Eosinophils Absolute: 85 cells/uL (ref 15–500)
Eosinophils Relative: 1 %
HCT: 47.9 % — ABNORMAL HIGH (ref 35.0–45.0)
Hemoglobin: 16.1 g/dL — ABNORMAL HIGH (ref 11.7–15.5)
Lymphocytes Relative: 29 %
Lymphs Abs: 2465 cells/uL (ref 850–3900)
MCH: 31.8 pg (ref 27.0–33.0)
MCHC: 33.6 g/dL (ref 32.0–36.0)
MCV: 94.7 fL (ref 80.0–100.0)
MPV: 9.8 fL (ref 7.5–12.5)
Monocytes Absolute: 680 cells/uL (ref 200–950)
Monocytes Relative: 8 %
Neutro Abs: 5185 cells/uL (ref 1500–7800)
Neutrophils Relative %: 61 %
Platelets: 203 10*3/uL (ref 140–400)
RBC: 5.06 MIL/uL (ref 3.80–5.10)
RDW: 13 % (ref 11.0–15.0)
WBC: 8.5 10*3/uL (ref 3.8–10.8)

## 2016-03-03 LAB — LIPID PANEL
Cholesterol: 190 mg/dL (ref 125–200)
HDL: 90 mg/dL (ref >=46)
LDL CALC: 87 mg/dL (ref <130)
Total CHOL/HDL Ratio: 2.1 Ratio (ref <=5.0)
Triglycerides: 64 mg/dL (ref <150)
VLDL: 13 mg/dL (ref <30)

## 2016-03-03 MED ORDER — CADUET 5-10 MG PO TABS: 1.0000 | ORAL_TABLET | Freq: Every day | ORAL | Status: DC

## 2016-03-03 NOTE — Progress Notes (Signed)
Subjective:    Patient ID: Kelly Merritt, female    DOB: 09/11/1946, 70 y.o.   MRN: [id number]  03/03/2016  Follow-up   HPI This 70 y.o. female presents for six month follow-up:   1. HTN:  Patient reports good compliance with medication, good tolerance to medication, and good symptom control.  Caduet is extremely expensive; cost $470 for 90 day supply; cost [employer] $1000 for 90 day supply.  Started taking all medications at night due to stomach upset.    2. Hyperlipidemia: Patient reports good compliance with medication, good tolerance to medication, and good symptom control.  Not having aching joints or gum bleeding with Caduet.  Omega daily.  3.  Skin cancer facial: undergoing treatment on R facial area.  Rare Ibuprofen; taking once a month.  Worried about recurrent ulcers.  Feels like stress related.  Took Mylanta in past when 70 years old.  Did not tolerate Prilosec well in the past.  Drinks a glass of milk every night.       Review of Systems  Constitutional: Negative for fever, chills, diaphoresis and fatigue.  Eyes: Negative for visual disturbance.  Respiratory: Negative for cough and shortness of breath.   Cardiovascular: Negative for chest pain, palpitations and leg swelling.  Gastrointestinal: Negative for nausea, vomiting, abdominal pain, diarrhea and constipation.  Endocrine: Negative for cold intolerance, heat intolerance, polydipsia, polyphagia and polyuria.  Neurological: Negative for dizziness, tremors, seizures, syncope, facial asymmetry, speech difficulty, weakness, light-headedness, numbness and headaches.    Past Medical History  Diagnosis Date  . Headache(784.0)   . Migraine, unspecified, without mention of intractable migraine without mention of status migrainosus   . Gastric ulcer, unspecified as acute or chronic, without mention of hemorrhage, perforation, or obstruction   . Tobacco use disorder   . Symptomatic menopausal or female climacteric states   .  Mitral valve disorders   . Pure hypercholesterolemia   . Benign neoplasm of other specified sites of skin     Squamous Cell Carcinoma abdomen; [facility]/derm  . Allergic rhinitis, cause unspecified   . Essential hypertension, benign   . Other abnormal glucose   . Osteoarthrosis, unspecified whether generalized or localized, lower leg     Severe OA hip.  . Cancer ([place])     Squamous Cell Carcinoma; followed annually by [facility]/[place]  . Allergy   . Substance abuse     CIGARETTES  . Depression    Past Surgical History  Procedure Laterality Date  . Tonsillectomy and adenoidectomy  1950  . Dilation and curettage of uterus  1998    heavy menses  . Cardiac catheterization  10/2008  . Resection of squamous cell carcinoma forearm  2013    abdomen 2015  . Joint replacement  12/03/2012    L THR  . Breast biopsy  1970   Allergies  Allergen Reactions  . Indomethacin Other (See Comments)    SEVERE STOMACH CRAMPS AND BLOOD IN URINE  . Lipitor [Atorvastatin] Other (See Comments)    myalgias  . Niacin And Related     Social History   Social History  . Marital Status: Married    Spouse Name: N/A  . Number of Children: 3  . Years of Education: college   Occupational History  . retired     Pharmacist, hospital   Social History Main Topics  . Smoking status: Current Every Day Smoker -- 30 years    Types: Cigarettes    Start date: 11/02/1966    Last Attempt  to Quit: 12/03/2014  . Smokeless tobacco: Never Used     Comment: LESS THAN 1/2 PACK-per patient RESTARTED 02/2015  . Alcohol Use: No     Comment: minimal  . Drug Use: No  . Sexual Activity: Yes    Birth Control/ Protection: Post-menopausal   Other Topics Concern  . Not on file   Social History Narrative      Marital status:  Married x 47 years. Moderately happy, verbal abuse only at times, denies physical abuse.         Children: 3 children, 7 grandchildren.        Lives: with husband, 53 yo son, 74 yo grandson.       Employment: retired from school system.  Taught school.        Tobacco abuse:  1/2-1 ppd.         Alcohol: weekends rarely.         Exercise: none      Always uses seat belts.         Smoke alarm and carbon monoxide detector in the home.                       Family History  Problem Relation Age of Onset  . Alcohol abuse Father   . Drug abuse Father   . Depression Father   . Cirrhosis Father   . Gout Father   . CAD Father     PTSD  . Heart disease Father 34    AMI/CABG  . Dementia Mother   . Hyperlipidemia Mother   . Hypertension Sister   . Hyperlipidemia Sister   . Leukemia Sister   . Hyperlipidemia Sister   . Hypertension Sister   . Heart disease Brother   . Hyperlipidemia Brother   . Hypertension Brother   . COPD Brother   . Ovarian cancer    . Breast cancer    . COPD Brother   . Cancer Sister     BOWEL  . Hyperlipidemia Sister   . Hypertension Sister   . Hyperlipidemia Sister   . Hypertension Sister        Objective:    BP 118/78 mmHg  Pulse 76  Temp(Src) 98 F (36.7 C) (Oral)  Resp 16  Ht 5' 5.75" (1.67 m)  Wt 153 lb (69.4 kg)  BMI 24.88 kg/m2  SpO2 95% Physical Exam  Constitutional: She is oriented to person, place, and time. She appears well-developed and well-nourished. No distress.  HENT:  Head: Normocephalic and atraumatic.  Right Ear: External ear normal.  Left Ear: External ear normal.  Nose: Nose normal.  Mouth/Throat: Oropharynx is clear and moist.  Eyes: Conjunctivae and EOM are normal. Pupils are equal, round, and reactive to light.  Neck: Normal range of motion. Neck supple. Carotid bruit is not present. No thyromegaly present.  Cardiovascular: Normal rate, regular rhythm, normal heart sounds and intact distal pulses.  Exam reveals no gallop and no friction rub.   No murmur heard. Pulmonary/Chest: Effort normal and breath sounds normal. She has no wheezes. She has no rales.  Abdominal: Soft. Bowel sounds are normal. She exhibits  no distension and no mass. There is no tenderness. There is no rebound and no guarding.  Lymphadenopathy:    She has no cervical adenopathy.  Neurological: She is alert and oriented to person, place, and time. No cranial nerve deficit.  Skin: Skin is warm and dry. No rash noted. She is not diaphoretic. No  erythema. No pallor.  Psychiatric: She has a normal mood and affect. Her behavior is normal.        Assessment & Plan:   1. Pure hypercholesterolemia   2. Essential hypertension, benign   3. Squamous cell carcinoma in situ of skin   4. Tobacco abuse   5. Primary osteoarthritis of left hip   6. Family history of coronary artery disease     Orders Placed This Encounter  Procedures  . CBC with Differential/Platelet  . Comprehensive metabolic panel    Order Specific Question:  Has the patient fasted?    Answer:  Yes  . Lipid panel    Order Specific Question:  Has the patient fasted?    Answer:  Yes   Meds ordered this encounter  Medications  . Calcium Citrate (CITRACAL PO)    Sig: Take by mouth daily.  Marland Kitchen aspirin 81 MG tablet    Sig: Take 81 mg by mouth daily.  Marland Kitchen CADUET 5-10 MG tablet    Sig: Take 1 tablet by mouth daily.    Dispense:  90 tablet    Refill:  1    NAME BRAND ONLY PLEASE    Return in about 6 months (around 09/03/2016) for complete physical examiniation.    Lessly Stigler Elayne Guerin, M.D. [facility] [address] [place], [place]  [address] [phone] phone [phone] fax

## 2016-03-03 NOTE — Patient Instructions (Addendum)
1.  Recommend Zantac 150mg  --- 1 tablet twice daily as needed fro gas, stomach burning.    IF you received an x-ray today, you will receive an invoice from [facility]. Please contact [facility] at [phone] with questions or concerns regarding your invoice.   IF you received labwork today, you will receive an invoice from [employer]. Please contact [facility] at [phone] with questions or concerns regarding your invoice.   Our billing staff will not be able to assist you with questions regarding bills from these companies.  You will be contacted with the lab results as soon as they are available. The fastest way to get your results is to activate your My Chart account. Instructions are located on the last page of this paperwork. If you have not heard from [place] regarding the results in 2 weeks, please contact this office.

## 2016-08-24 ENCOUNTER — Other Ambulatory Visit: Payer: Self-pay | Admitting: Family Medicine

## 2016-08-24 NOTE — Telephone Encounter (Signed)
03/03/2016 last ov 08/2015 last refill x 1 additional

## 2016-08-25 NOTE — Telephone Encounter (Signed)
Ibuprofen refill approved.

## 2016-11-04 ENCOUNTER — Ambulatory Visit (INDEPENDENT_AMBULATORY_CARE_PROVIDER_SITE_OTHER): Payer: Medicare Other | Admitting: Family Medicine

## 2016-11-04 ENCOUNTER — Encounter: Payer: Self-pay | Admitting: Family Medicine

## 2016-11-04 VITALS — BP 128/82 | HR 67 | Temp 97.9°F | Resp 16 | Ht 65.0 in | Wt 153.0 lb

## 2016-11-04 DIAGNOSIS — M1612 Unilateral primary osteoarthritis, left hip: Secondary | ICD-10-CM | POA: Diagnosis not present

## 2016-11-04 DIAGNOSIS — I10 Essential (primary) hypertension: Secondary | ICD-10-CM

## 2016-11-04 DIAGNOSIS — Z0000 Encounter for general adult medical examination without abnormal findings: Secondary | ICD-10-CM

## 2016-11-04 DIAGNOSIS — Z720 Tobacco use: Secondary | ICD-10-CM

## 2016-11-04 DIAGNOSIS — Z8249 Family history of ischemic heart disease and other diseases of the circulatory system: Secondary | ICD-10-CM

## 2016-11-04 DIAGNOSIS — Z1211 Encounter for screening for malignant neoplasm of colon: Secondary | ICD-10-CM

## 2016-11-04 DIAGNOSIS — D049 Carcinoma in situ of skin, unspecified: Secondary | ICD-10-CM

## 2016-11-04 DIAGNOSIS — Z1231 Encounter for screening mammogram for malignant neoplasm of breast: Secondary | ICD-10-CM

## 2016-11-04 DIAGNOSIS — R7309 Other abnormal glucose: Secondary | ICD-10-CM | POA: Diagnosis not present

## 2016-11-04 DIAGNOSIS — E7800 Pure hypercholesterolemia, unspecified: Secondary | ICD-10-CM

## 2016-11-04 LAB — POCT URINALYSIS DIP (MANUAL ENTRY)
BILIRUBIN UA: NEGATIVE
BILIRUBIN UA: NEGATIVE
Glucose, UA: NEGATIVE
LEUKOCYTES UA: NEGATIVE
Nitrite, UA: NEGATIVE
PH UA: 5
PROTEIN UA: NEGATIVE
Spec Grav, UA: 1.02
Urobilinogen, UA: 0.2

## 2016-11-04 MED ORDER — CALCIUM CITRATE 950 (200 CA) MG PO TABS: 200.0000 mg | ORAL_TABLET | Freq: Every day | ORAL | 0 refills | Status: DC

## 2016-11-04 MED ORDER — AMLODIPINE-ATORVASTATIN 5-10 MG PO TABS: 1.0000 | ORAL_TABLET | Freq: Every day | ORAL | 1 refills | Status: DC

## 2016-11-04 MED ORDER — IBUPROFEN 800 MG PO TABS: 800.0000 mg | ORAL_TABLET | Freq: Three times a day (TID) | ORAL | 1 refills | Status: DC | PRN

## 2016-11-04 NOTE — Progress Notes (Signed)
Subjective:    Patient ID: Kelly Merritt, female    DOB: [birth year], 71 y.o.   MRN: [id number]  11/04/2016  Annual Exam (pap)   HPI This 71 y.o. female presents for Annual Wellness Examination.  Last physical: 08-12-2015 Pap smear: 08-18-2014 Mammogram:  09-07-2014 Colonoscopy: REFUSES; agreeable to stool cards. Bone density: REFUSES Eye exam: overdue Dental exam: every six months.  Immunization History  Administered Date(s) Administered  . Pneumococcal-Unspecified 06/20/2009  . Td 09/21/2007  . Tdap 07/09/2011   REFUSES ZOSTAVAX, INFLUENZA, PNEUMOVAX.   BP Readings from Last 3 Encounters:  11/04/16 128/82  03/03/16 118/78  08/12/15 120/83   Wt Readings from Last 3 Encounters:  11/04/16 153 lb (69.4 kg)  03/03/16 153 lb (69.4 kg)  08/12/15 154 lb 9.6 oz (70.1 kg)   HTN: Patient reports good compliance with medication, good tolerance to medication, and good symptom control.  120s/70s.  Hypercholesterolemia: Patient reports good compliance with medication, good tolerance to medication, and good symptom control.  Taking Caduet generic; no copay.   Review of Systems  Constitutional: Negative for activity change, appetite change, chills, diaphoresis, fatigue, fever and unexpected weight change.  HENT: Negative for congestion, dental problem, drooling, ear discharge, ear pain, facial swelling, hearing loss, mouth sores, nosebleeds, postnasal drip, rhinorrhea, sinus pressure, sneezing, sore throat, tinnitus, trouble swallowing and voice change.   Eyes: Negative for photophobia, pain, discharge, redness, itching and visual disturbance.  Respiratory: Negative for apnea, cough, choking, chest tightness, shortness of breath, wheezing and stridor.   Cardiovascular: Negative for chest pain, palpitations and leg swelling.  Gastrointestinal: Negative for abdominal distention, abdominal pain, anal bleeding, blood in stool, constipation, diarrhea, nausea, rectal pain and vomiting.    Endocrine: Negative for cold intolerance, heat intolerance, polydipsia, polyphagia and polyuria.  Genitourinary: Negative for decreased urine volume, difficulty urinating, dyspareunia, dysuria, enuresis, flank pain, frequency, genital sores, hematuria, menstrual problem, pelvic pain, urgency, vaginal bleeding, vaginal discharge and vaginal pain.       Nocturia x 1-3.  Urinary leakage.  Musculoskeletal: Positive for arthralgias and myalgias. Negative for back pain, gait problem, joint swelling, neck pain and neck stiffness.  Skin: Negative for color change, pallor, rash and wound.  Allergic/Immunologic: Negative for environmental allergies, food allergies and immunocompromised state.  Neurological: Negative for dizziness, tremors, seizures, syncope, facial asymmetry, speech difficulty, weakness, light-headedness, numbness and headaches.  Hematological: Negative for adenopathy. Does not bruise/bleed easily.  Psychiatric/Behavioral: Negative for agitation, behavioral problems, confusion, decreased concentration, dysphoric mood, hallucinations, self-injury, sleep disturbance and suicidal ideas. The patient is not nervous/anxious and is not hyperactive.        Bedtime 10-12; wakes up 7.    Past Medical History:  Diagnosis Date  . Allergic rhinitis, cause unspecified   . Allergy   . Benign neoplasm of other specified sites of skin    Squamous Cell Carcinoma abdomen; [facility]enstein/derm  . Cancer ([place])    Squamous Cell Carcinoma; followed annually by Isenstein/[facility]  . Depression   . Essential hypertension, benign   . Gastric ulcer, unspecified as acute or chronic, without mention of hemorrhage, perforation, or obstruction   . Headache(784.0)   . Migraine, unspecified, without mention of intractable migraine without mention of status migrainosus   . Mitral valve disorders(424.0)   . Osteoarthrosis, unspecified whether generalized or localized, lower leg    Severe OA hip.  . Other abnormal  glucose   . Pure hypercholesterolemia   . Substance abuse    CIGARETTES  . Symptomatic menopausal or female  climacteric states   . Tobacco use disorder    Past Surgical History:  Procedure Laterality Date  . BREAST BIOPSY  1970  . CARDIAC CATHETERIZATION  10/2008  . DILATION AND CURETTAGE OF UTERUS  1998   heavy menses  . JOINT REPLACEMENT  12/03/2012   L THR  . resection of squamous cell carcinoma forearm  2013   abdomen 2015  . TONSILLECTOMY AND ADENOIDECTOMY  1950   Allergies  Allergen Reactions  . Indomethacin Other (See Comments)    SEVERE STOMACH CRAMPS AND BLOOD IN URINE  . Lipitor [Atorvastatin] Other (See Comments)    myalgias  . Niacin And Related     Social History   Social History  . Marital status: Married    Spouse name: N/A  . Number of children: 3  . Years of education: college   Occupational History  . retired     Pharmacist, hospital   Social History Main Topics  . Smoking status: Current Every Day Smoker    Years: 30.00    Types: Cigarettes    Start date: 11/02/1966    Last attempt to quit: 12/03/2014  . Smokeless tobacco: Never Used     Comment: LESS THAN 1/2 PACK-per patient RESTARTED 02/2015  . Alcohol use No     Comment: minimal  . Drug use: No  . Sexual activity: Yes    Birth control/ protection: Post-menopausal   Other Topics Concern  . Not on file   Social History Narrative      Marital status:  Married x 48 years. Moderately happy, verbal abuse only at times, denies physical abuse.         Children: 3 children, 7 grandchildren.        Lives: with husband, 66 yo son, 36 yo grandson, 2 dogs.      Employment: retired from school system.  Taught school.        Tobacco abuse:  1/2-1 ppd.         Alcohol: weekends rarely.         Exercise: none      Always uses seat belts.         Smoke alarm and carbon monoxide detector in the home.       ADLs: independent with ADLs; no assistant devices.         Advanced Directives:  NONE in 2018.                         Family History  Problem Relation Age of Onset  . Alcohol abuse Father   . Drug abuse Father   . Depression Father   . Cirrhosis Father   . Gout Father   . CAD Father     PTSD  . Heart disease Father 61    AMI/CABG  . Dementia Mother   . Hyperlipidemia Mother   . Hypertension Sister   . Hyperlipidemia Sister   . Leukemia Sister   . Hyperlipidemia Sister   . Hypertension Sister   . Heart disease Brother   . Hyperlipidemia Brother   . Hypertension Brother   . COPD Brother   . COPD Brother   . Cancer Sister     BOWEL  . Hyperlipidemia Sister   . Hypertension Sister   . Hyperlipidemia Sister   . Hypertension Sister   . Ovarian cancer    . Breast cancer         Objective:    BP 128/82  Pulse 67   Temp 97.9 F (36.6 C) (Oral)   Resp 16   Ht 5\' 5"  (1.651 m)   Wt 153 lb (69.4 kg)   SpO2 96%   BMI 25.46 kg/m  Physical Exam  Constitutional: She is oriented to person, place, and time. She appears well-developed and well-nourished. No distress.  HENT:  Head: Normocephalic and atraumatic.  Right Ear: External ear normal.  Left Ear: External ear normal.  Nose: Nose normal.  Mouth/Throat: Oropharynx is clear and moist.  Eyes: Conjunctivae and EOM are normal. Pupils are equal, round, and reactive to light.  Neck: Normal range of motion and full passive range of motion without pain. Neck supple. No JVD present. Carotid bruit is not present. No thyromegaly present.  Cardiovascular: Normal rate, regular rhythm and normal heart sounds.  Exam reveals no gallop and no friction rub.   No murmur heard. Pulmonary/Chest: Effort normal and breath sounds normal. She has no wheezes. She has no rales. Right breast exhibits no inverted nipple, no mass, no nipple discharge, no skin change and no tenderness. Left breast exhibits no inverted nipple, no mass, no nipple discharge, no skin change and no tenderness.  Abdominal: Soft. Bowel sounds are normal. She exhibits no  distension and no mass. There is no tenderness. There is no rebound and no guarding.  Musculoskeletal:       Right shoulder: Normal.       Left shoulder: Normal.       Cervical back: Normal.  Lymphadenopathy:    She has no cervical adenopathy.  Neurological: She is alert and oriented to person, place, and time. She has normal reflexes. No cranial nerve deficit. She exhibits normal muscle tone. Coordination normal.  Skin: Skin is warm and dry. No rash noted. She is not diaphoretic. No erythema. No pallor.  Psychiatric: She has a normal mood and affect. Her behavior is normal. Judgment and thought content normal.  Nursing note and vitals reviewed.   Depression screen [facility] 2/9 11/04/2016 03/03/2016 08/12/2015 02/06/2015 08/08/2014  Decreased Interest 0 0 0 2 0  Down, Depressed, Hopeless 0 0 0 1 0  PHQ - 2 Score 0 0 0 3 0  Altered sleeping - - - 1 -  Tired, decreased energy - - - 1 -  Change in appetite - - - 3 -  Feeling bad or failure about yourself  - - - 2 -  Trouble concentrating - - - 2 -  Moving slowly or fidgety/restless - - - 0 -  PHQ-9 Score - - - 12 -   Fall Risk  11/04/2016 03/03/2016 08/12/2015 02/06/2015 08/08/2014  Falls in the past year? No No Yes Yes Yes  Number falls in past yr: - - 1 1 1   Injury with Fall? - - No No -    Functional Status Survey: Is the patient deaf or have difficulty hearing?: No Does the patient have difficulty seeing, even when wearing glasses/contacts?: No Does the patient have difficulty concentrating, remembering, or making decisions?: No Does the patient have difficulty walking or climbing stairs?: No Does the patient have difficulty dressing or bathing?: No Does the patient have difficulty doing errands alone such as visiting a doctor's office or shopping?: No     Assessment & Plan:   1. Encounter for Medicare annual wellness exam   2. Essential hypertension, benign   3. Primary osteoarthritis of left hip   4. Squamous cell carcinoma in situ of  skin   5. Other abnormal glucose  6. Pure hypercholesterolemia   7. Tobacco abuse   8. Visit for screening mammogram   9. Family history of coronary artery disease   10. Colon cancer screening     Orders Placed This Encounter  Procedures  . MM SCREENING BREAST TOMO BILATERAL    Standing Status:   Future    Standing Expiration Date:   01/05/2018    Order Specific Question:   Reason for Exam (SYMPTOM  OR DIAGNOSIS REQUIRED)    Answer:   annual screening    Order Specific Question:   Preferred imaging location?    Answer:   [facility]  . CBC with Differential/Platelet  . Comprehensive metabolic panel    Order Specific Question:   Has the patient fasted?    Answer:   Yes  . Lipid panel    Order Specific Question:   Has the patient fasted?    Answer:   Yes  . POCT urinalysis dipstick  . POC Hemoccult Bld/Stl (3-Cd Home Screen)    Standing Status:   Future    Number of Occurrences:   1    Standing Expiration Date:   11/04/2017  . EKG 12-Lead   Meds ordered this encounter  Medications  . amLODipine-atorvastatin (CADUET) 5-10 MG tablet    Sig: Take 1 tablet by mouth daily.    Dispense:  90 tablet    Refill:  1    Generic please.  Marland Kitchen ibuprofen (ADVIL,MOTRIN) 800 MG tablet    Sig: Take 1 tablet (800 mg total) by mouth every 8 (eight) hours as needed.    Dispense:  90 tablet    Refill:  1    Please consider 90 day supplies to promote better adherence  . calcium citrate (CALCITRATE - DOSED IN MG ELEMENTAL CALCIUM) 950 MG tablet    Sig: Take 1 tablet (200 mg of elemental calcium total) by mouth daily.    Dispense:  1 tablet    Refill:  0    Return in about 6 months (around 05/04/2017) for recheck blood pressure, high cholesterol.   Fermina Mishkin Elayne Guerin, M.D. [facility] [address] [place], [place]  [address] [phone] phone [phone] fax

## 2016-11-04 NOTE — Patient Instructions (Addendum)
   IF you received an x-ray today, you will receive an invoice from [facility]. Please contact  Radiology at [phone] with questions or concerns regarding your invoice.   IF you received labwork today, you will receive an invoice from [facility]. Please contact [facility] at [phone] with questions or concerns regarding your invoice.   Our billing staff will not be able to assist you with questions regarding bills from these companies.  You will be contacted with the lab results as soon as they are available. The fastest way to get your results is to activate your My Chart account. Instructions are located on the last page of this paperwork. If you have not heard from us regarding the results in 2 weeks, please contact this office.     Keeping You Healthy  Get These Tests  Blood Pressure- Have your blood pressure checked by your healthcare provider at least once a year.  Normal blood pressure is 120/80.  Weight- Have your body mass index (BMI) calculated to screen for obesity.  BMI is a measure of body fat based on height and weight.  You can calculate your own BMI at www.nhlbisupport.com/bmi/  Cholesterol- Have your cholesterol checked every year.  Diabetes- Have your blood sugar checked every year if you have high blood pressure, high cholesterol, a family history of diabetes or if you are overweight.  Pap Test - Have a pap test every 1 to 5 years if you have been sexually active.  If you are older than 65 and recent pap tests have been normal you may not need additional pap tests.  In addition, if you have had a hysterectomy  for benign disease additional pap tests are not necessary.  Mammogram-Yearly mammograms are essential for early detection of breast cancer  Screening for Colon Cancer- Colonoscopy starting at age 50. Screening may begin sooner depending on your family history and other health conditions.  Follow up colonoscopy as directed by your  Gastroenterologist.  Screening for Osteoporosis- Screening begins at age 65 with bone density scanning, sooner if you are at higher risk for developing Osteoporosis.  Get these medicines  Calcium with Vitamin D- Your body requires 1200-1500 mg of Calcium a day and 800-1000 IU of Vitamin D a day.  You can only absorb 500 mg of Calcium at a time therefore Calcium must be taken in 2 or 3 separate doses throughout the day.  Hormones- Hormone therapy has been associated with increased risk for certain cancers and heart disease.  Talk to your healthcare provider about if you need relief from menopausal symptoms.  Aspirin- Ask your healthcare provider about taking Aspirin to prevent Heart Disease and Stroke.  Get these Immuniztions  Flu shot- Every fall  Pneumonia shot- Once after the age of 65; if you are younger ask your healthcare provider if you need a pneumonia shot.  Tetanus- Every ten years.  Zostavax- Once after the age of 60 to prevent shingles.  Take these steps  Don't smoke- Your healthcare provider can help you quit. For tips on how to quit, ask your healthcare provider or go to www.smokefree.gov or call 1-800 QUIT-NOW.  Be physically active- Exercise 5 days a week for a minimum of 30 minutes.  If you are not already physically active, start slow and gradually work up to 30 minutes of moderate physical activity.  Try walking, dancing, bike riding, swimming, etc.  Eat a healthy diet- Eat a variety of healthy foods such as fruits, vegetables, whole grains, low   fat milk, low fat cheeses, yogurt, lean meats, chicken, fish, eggs, dried beans, tofu, etc.  For more information go to www.thenutritionsource.org  Dental visit- Brush and floss teeth twice daily; visit your dentist twice a year.  Eye exam- Visit your Optometrist or Ophthalmologist yearly.  Drink alcohol in moderation- Limit alcohol intake to one drink or less a day.  Never drink and drive.  Depression- Your emotional  health is as important as your physical health.  If you're feeling down or losing interest in things you normally enjoy, please talk to your healthcare provider.  Seat Belts- can save your life; always wear one  Smoke/Carbon Monoxide detectors- These detectors need to be installed on the appropriate level of your home.  Replace batteries at least once a year.  Violence- If anyone is threatening or hurting you, please tell your healthcare provider.  Living Will/ Health care power of attorney- Discuss with your healthcare provider and family.  

## 2016-11-05 LAB — COMPREHENSIVE METABOLIC PANEL
ALBUMIN: 4.3 g/dL (ref 3.6–4.8)
ALT: 27 IU/L (ref 0–32)
AST: 30 IU/L (ref 0–40)
Albumin/Globulin Ratio: 1.7 (ref 1.2–2.2)
Alkaline Phosphatase: 78 IU/L (ref 39–117)
BILIRUBIN TOTAL: 0.7 mg/dL (ref 0.0–1.2)
BUN / CREAT RATIO: 15 (ref 12–28)
BUN: 13 mg/dL (ref 8–27)
CO2: 24 mmol/L (ref 18–29)
Calcium: 9.6 mg/dL (ref 8.7–10.3)
Chloride: 101 mmol/L (ref 96–106)
Creatinine, Ser: 0.89 mg/dL (ref 0.57–1.00)
GFR calc Af Amer: 77 mL/min/{1.73_m2} (ref >59)
GFR calc non Af Amer: 67 mL/min/{1.73_m2} (ref >59)
GLOBULIN, TOTAL: 2.5 g/dL (ref 1.5–4.5)
GLUCOSE: 106 mg/dL — AB (ref 65–99)
Potassium: 4.5 mmol/L (ref 3.5–5.2)
SODIUM: 141 mmol/L (ref 134–144)
Total Protein: 6.8 g/dL (ref 6.0–8.5)

## 2016-11-05 LAB — LIPID PANEL
CHOLESTEROL TOTAL: 205 mg/dL — AB (ref 100–199)
Chol/HDL Ratio: 2.3 ratio units (ref 0.0–4.4)
HDL: 91 mg/dL (ref >39)
LDL Calculated: 95 mg/dL (ref 0–99)
Triglycerides: 95 mg/dL (ref 0–149)
VLDL Cholesterol Cal: 19 mg/dL (ref 5–40)

## 2016-11-05 LAB — CBC WITH DIFFERENTIAL/PLATELET
BASOS ABS: 0 10*3/uL (ref 0.0–0.2)
Basos: 0 %
EOS (ABSOLUTE): 0.2 10*3/uL (ref 0.0–0.4)
Eos: 2 %
HEMOGLOBIN: 16.5 g/dL — AB (ref 11.1–15.9)
Hematocrit: 45.9 % (ref 34.0–46.6)
Immature Grans (Abs): 0 10*3/uL (ref 0.0–0.1)
Immature Granulocytes: 0 %
LYMPHS ABS: 2.8 10*3/uL (ref 0.7–3.1)
Lymphs: 28 %
MCH: 33.1 pg — AB (ref 26.6–33.0)
MCHC: 35.9 g/dL — AB (ref 31.5–35.7)
MCV: 92 fL (ref 79–97)
MONOCYTES: 8 %
MONOS ABS: 0.8 10*3/uL (ref 0.1–0.9)
NEUTROS ABS: 6.3 10*3/uL (ref 1.4–7.0)
Neutrophils: 62 %
Platelets: 202 10*3/uL (ref 150–379)
RBC: 4.99 x10E6/uL (ref 3.77–5.28)
RDW: 12.8 % (ref 12.3–15.4)
WBC: 10.1 10*3/uL (ref 3.4–10.8)

## 2016-11-11 LAB — POC HEMOCCULT BLD/STL (HOME/3-CARD/SCREEN)
Card #2 Fecal Occult Blod, POC: NEGATIVE
Card #3 Fecal Occult Blood, POC: NEGATIVE
FECAL OCCULT BLD: NEGATIVE

## 2017-05-11 ENCOUNTER — Encounter: Payer: Self-pay | Admitting: Family Medicine

## 2017-05-11 ENCOUNTER — Ambulatory Visit (INDEPENDENT_AMBULATORY_CARE_PROVIDER_SITE_OTHER): Payer: Medicare Other | Admitting: Family Medicine

## 2017-05-11 ENCOUNTER — Ambulatory Visit (INDEPENDENT_AMBULATORY_CARE_PROVIDER_SITE_OTHER): Payer: Medicare Other

## 2017-05-11 VITALS — BP 129/84 | HR 69 | Temp 98.0°F | Resp 18 | Ht 65.75 in | Wt 161.0 lb

## 2017-05-11 DIAGNOSIS — Z720 Tobacco use: Secondary | ICD-10-CM | POA: Diagnosis not present

## 2017-05-11 DIAGNOSIS — M25561 Pain in right knee: Secondary | ICD-10-CM

## 2017-05-11 DIAGNOSIS — D049 Carcinoma in situ of skin, unspecified: Secondary | ICD-10-CM | POA: Diagnosis not present

## 2017-05-11 DIAGNOSIS — E7800 Pure hypercholesterolemia, unspecified: Secondary | ICD-10-CM

## 2017-05-11 DIAGNOSIS — Z8249 Family history of ischemic heart disease and other diseases of the circulatory system: Secondary | ICD-10-CM

## 2017-05-11 DIAGNOSIS — I10 Essential (primary) hypertension: Secondary | ICD-10-CM | POA: Diagnosis not present

## 2017-05-11 DIAGNOSIS — R7309 Other abnormal glucose: Secondary | ICD-10-CM | POA: Diagnosis not present

## 2017-05-11 MED ORDER — AMLODIPINE-ATORVASTATIN 5-10 MG PO TABS: 1.0000 | ORAL_TABLET | Freq: Every day | ORAL | 1 refills | Status: DC

## 2017-05-11 NOTE — Progress Notes (Signed)
Subjective:    Patient ID: Kelly Merritt, female    DOB: Jun 24, 1946, 71 y.o.   MRN: [id number]  05/11/2017  Hypertension (6 month follow-up) and Hyperlipidemia   HPI This 71 y.o. female presents for evaluation of hypertension and hypercholesterolemia.  No changes to management made at last visit.  Daughter's husband is not doing well.  Daughter has gone back to work.  Keeping grandchildren; taking care of three grandchildren yet one is a teenager.  Very needy right now because father is non-functional.  Keeping grandchildren 40 hours per week for daughter.  Exhausted.   Needs to sit and relax.    R knee pain: needs to see orthopedist.  Having horrible knee pain.  Last visit with orthopedist 12/2015.  Last xrays of knees B 2013 which is WNL.  Unable to straighten RIGHT knee.    BP Readings from Last 3 Encounters:  05/11/17 129/84  11/04/16 128/82  03/03/16 118/78   Wt Readings from Last 3 Encounters:  05/11/17 161 lb (73 kg)  11/04/16 153 lb (69.4 kg)  03/03/16 153 lb (69.4 kg)   Immunization History  Administered Date(s) Administered  . Pneumococcal-Unspecified 06/20/2009  . Td 09/21/2007  . Tdap 07/09/2011     Review of Systems  Constitutional: Negative for chills, diaphoresis, fatigue and fever.  Eyes: Negative for visual disturbance.  Respiratory: Negative for cough and shortness of breath.   Cardiovascular: Negative for chest pain, palpitations and leg swelling.  Gastrointestinal: Negative for abdominal pain, constipation, diarrhea, nausea and vomiting.  Endocrine: Negative for cold intolerance, heat intolerance, polydipsia, polyphagia and polyuria.  Musculoskeletal: Positive for arthralgias and gait problem.  Neurological: Negative for dizziness, tremors, seizures, syncope, facial asymmetry, speech difficulty, weakness, light-headedness, numbness and headaches.  Psychiatric/Behavioral: The patient is nervous/anxious.     Past Medical History:  Diagnosis Date  .  Allergic rhinitis, cause unspecified   . Allergy   . Benign neoplasm of other specified sites of skin    Squamous Cell Carcinoma abdomen; [facility]  . Cancer ([place])    Squamous Cell Carcinoma; followed annually by [facility]  . Depression   . Essential hypertension, benign   . Gastric ulcer, unspecified as acute or chronic, without mention of hemorrhage, perforation, or obstruction   . Headache(784.0)   . Migraine, unspecified, without mention of intractable migraine without mention of status migrainosus   . Mitral valve disorders(424.0)   . Osteoarthrosis, unspecified whether generalized or localized, lower leg    Severe OA hip.  . Other abnormal glucose   . Pure hypercholesterolemia   . Substance abuse    CIGARETTES  . Symptomatic menopausal or female climacteric states   . Tobacco use disorder    Past Surgical History:  Procedure Laterality Date  . BREAST BIOPSY  1970  . CARDIAC CATHETERIZATION  10/2008  . DILATION AND CURETTAGE OF UTERUS  1998   heavy menses  . JOINT REPLACEMENT  12/03/2012   L THR  . resection of squamous cell carcinoma forearm  2013   abdomen 2015  . TONSILLECTOMY AND ADENOIDECTOMY  1950   Allergies  Allergen Reactions  . Indomethacin Other (See Comments)    SEVERE STOMACH CRAMPS AND BLOOD IN URINE  . Lipitor [Atorvastatin] Other (See Comments)    myalgias  . Niacin And Related    Current Outpatient Prescriptions  Medication Sig Dispense Refill  . amLODipine-atorvastatin (CADUET) 5-10 MG tablet Take 1 tablet by mouth daily. 90 tablet 1  . aspirin 81 MG tablet Take 81 mg  by mouth daily.    . calcium citrate (CALCITRATE - DOSED IN MG ELEMENTAL CALCIUM) 950 MG tablet Take 1 tablet (200 mg of elemental calcium total) by mouth daily. 1 tablet 0  . Glucosamine-Chondroitin 500-400 MG CAPS Take by mouth.    Marland Kitchen ibuprofen (ADVIL,MOTRIN) 800 MG tablet Take 1 tablet (800 mg total) by mouth every 8 (eight) hours as needed. 90 tablet 1  . Multiple  Vitamin (MULTIVITAMINS PO) Take by mouth daily.    . OMEGA 3 1000 MG CAPS Take 2 capsules by mouth daily.    . vitamin C (ASCORBIC ACID) 250 MG tablet Take 250 mg by mouth daily.     No current facility-administered medications for this visit.    Social History   Social History  . Marital status: Married    Spouse name: N/A  . Number of children: 3  . Years of education: college   Occupational History  . retired     Pharmacist, hospital   Social History Main Topics  . Smoking status: Current Every Day Smoker    Years: 30.00    Types: Cigarettes    Start date: 11/02/1966    Last attempt to quit: 12/03/2014  . Smokeless tobacco: Never Used     Comment: LESS THAN 1/2 PACK-per patient RESTARTED 02/2015  . Alcohol use No     Comment: minimal  . Drug use: No  . Sexual activity: Yes    Birth control/ protection: Post-menopausal   Other Topics Concern  . Not on file   Social History Narrative      Marital status:  Married x 48 years. Moderately happy, verbal abuse only at times, denies physical abuse.         Children: 3 children, 7 grandchildren.        Lives: with husband, 4 yo son, 23 yo grandson, 2 dogs.      Employment: retired from school system.  Taught school.        Tobacco abuse:  1/2-1 ppd.         Alcohol: weekends rarely.         Exercise: none      Always uses seat belts.         Smoke alarm and carbon monoxide detector in the home.       ADLs: independent with ADLs; no assistant devices.         Advanced Directives:  NONE in 2018.                       Family History  Problem Relation Age of Onset  . Alcohol abuse Father   . Drug abuse Father   . Depression Father   . Cirrhosis Father   . Gout Father   . CAD Father        PTSD  . Heart disease Father 63       AMI/CABG  . Dementia Mother   . Hyperlipidemia Mother   . Hypertension Sister   . Hyperlipidemia Sister   . Leukemia Sister   . Hyperlipidemia Sister   . Hypertension Sister   . Heart disease Brother    . Hyperlipidemia Brother   . Hypertension Brother   . COPD Brother   . COPD Brother   . Cancer Sister        BOWEL  . Hyperlipidemia Sister   . Hypertension Sister   . Hyperlipidemia Sister   . Hypertension Sister   . Ovarian cancer Unknown   .  Breast cancer Unknown        Objective:    BP 129/84   Pulse 69   Temp 98 F (36.7 C) (Oral)   Resp 18   Ht 5' 5.75" (1.67 m)   Wt 161 lb (73 kg)   SpO2 95%   BMI 26.19 kg/m  Physical Exam  Constitutional: She is oriented to person, place, and time. She appears well-developed and well-nourished. No distress.  HENT:  Head: Normocephalic and atraumatic.  Right Ear: External ear normal.  Left Ear: External ear normal.  Nose: Nose normal.  Mouth/Throat: Oropharynx is clear and moist.  Eyes: Conjunctivae and EOM are normal. Pupils are equal, round, and reactive to light.  Neck: Normal range of motion. Neck supple. Carotid bruit is not present. No thyromegaly present.  Cardiovascular: Normal rate, regular rhythm, normal heart sounds and intact distal pulses.  Exam reveals no gallop and no friction rub.   No murmur heard. Pulmonary/Chest: Effort normal and breath sounds normal. She has no wheezes. She has no rales.  Abdominal: Soft. Bowel sounds are normal. She exhibits no distension and no mass. There is no tenderness. There is no rebound and no guarding.  Musculoskeletal:       Right knee: She exhibits decreased range of motion. No tenderness found. No medial joint line and no lateral joint line tenderness noted.  Lymphadenopathy:    She has no cervical adenopathy.  Neurological: She is alert and oriented to person, place, and time. No cranial nerve deficit.  Skin: Skin is warm and dry. No rash noted. She is not diaphoretic. No erythema. No pallor.  Psychiatric: She has a normal mood and affect. Her behavior is normal.        Assessment & Plan:   1. Essential hypertension, benign   2. Squamous cell carcinoma in situ of skin    3. Family history of coronary artery disease   4. Other abnormal glucose   5. Pure hypercholesterolemia   6. Tobacco abuse   7. Acute pain of right knee    -controlled; obtain labs; refills provided. -worsening R knee pain; obtain R knee film; recommend rest, icing, home exercise program.  If no improvement in 2-4 weeks, follow-up with ortho.   Orders Placed This Encounter  Procedures  . DG Knee Complete 4 Views Right    Standing Status:   Future    Standing Expiration Date:   05/11/2018    Order Specific Question:   Reason for Exam (SYMPTOM  OR DIAGNOSIS REQUIRED)    Answer:   R knee pain for two months; no injury; swelling; stiffness    Order Specific Question:   Preferred imaging location?    Answer:   External  . CBC with Differential/Platelet  . Comprehensive metabolic panel    Order Specific Question:   Has the patient fasted?    Answer:   Yes  . Hemoglobin A1c  . Lipid panel    Order Specific Question:   Has the patient fasted?    Answer:   Yes   Meds ordered this encounter  Medications  . amLODipine-atorvastatin (CADUET) 5-10 MG tablet    Sig: Take 1 tablet by mouth daily.    Dispense:  90 tablet    Refill:  1    Generic please.    Return in about 6 months (around 11/11/2017) for complete physical examiniation.   Jeson Camacho Elayne Guerin, M.D. Primary Care at [facility] [address]  [place],   [address] [phone] phone [phone] fax

## 2017-05-11 NOTE — Patient Instructions (Addendum)
pc    IF you received an x-ray today, you will receive an invoice from [facility]. Please contact [facility] at [phone] with questions or concerns regarding your invoice.   IF you received labwork today, you will receive an invoice from [facility]. Please contact [facility] at [phone] with questions or concerns regarding your invoice.   Our billing staff will not be able to assist you with questions regarding bills from these companies.  You will be contacted with the lab results as soon as they are available. The fastest way to get your results is to activate your My Chart account. Instructions are located on the last page of this paperwork. If you have not heard from [place] regarding the results in 2 weeks, please contact this office.      Knee Exercises Ask your health care provider which exercises are safe for you. Do exercises exactly as told by your health care provider and adjust them as directed. It is normal to feel mild stretching, pulling, tightness, or discomfort as you do these exercises, but you should stop right away if you feel sudden pain or your pain gets worse.Do not begin these exercises until told by your health care provider. STRETCHING AND RANGE OF MOTION EXERCISES These exercises warm up your muscles and joints and improve the movement and flexibility of your knee. These exercises also help to relieve pain, numbness, and tingling. Exercise A: Knee Extension, Prone 1. Lie on your abdomen on a bed. 2. Place your left / right knee just beyond the edge of the surface so your knee is not on the bed. You can put a towel under your left / right thigh just above your knee for comfort. 3. Relax your leg muscles and allow gravity to straighten your knee. You should feel a stretch behind your left / right knee. 4. Hold this position for __________ seconds. 5. Scoot up so your knee is supported between repetitions. Repeat __________ times. Complete this  stretch __________ times a day. Exercise B: Knee Flexion, Active  1. Lie on your back with both knees straight. If this causes back discomfort, bend your left / right knee so your foot is flat on the floor. 2. Slowly slide your left / right heel back toward your buttocks until you feel a gentle stretch in the front of your knee or thigh. 3. Hold this position for __________ seconds. 4. Slowly slide your left / right heel back to the starting position. Repeat __________ times. Complete this exercise __________ times a day. Exercise C: Quadriceps, Prone  1. Lie on your abdomen on a firm surface, such as a bed or padded floor. 2. Bend your left / right knee and hold your ankle. If you cannot reach your ankle or pant leg, loop a belt around your foot and grab the belt instead. 3. Gently pull your heel toward your buttocks. Your knee should not slide out to the side. You should feel a stretch in the front of your thigh and knee. 4. Hold this position for __________ seconds. Repeat __________ times. Complete this stretch __________ times a day. Exercise D: Hamstring, Supine 1. Lie on your back. 2. Loop a belt or towel over the ball of your left / right foot. The ball of your foot is on the walking surface, right under your toes. 3. Straighten your left / right knee and slowly pull on the belt to raise your leg until you feel a gentle stretch behind your knee. ? Do not  let your left / right knee bend while you do this. ? Keep your other leg flat on the floor. 4. Hold this position for __________ seconds. Repeat __________ times. Complete this stretch __________ times a day. STRENGTHENING EXERCISES These exercises build strength and endurance in your knee. Endurance is the ability to use your muscles for a long time, even after they get tired. Exercise E: Quadriceps, Isometric  1. Lie on your back with your left / right leg extended and your other knee bent. Put a rolled towel or small pillow  under your knee if told by your health care provider. 2. Slowly tense the muscles in the front of your left / right thigh. You should see your kneecap slide up toward your hip or see increased dimpling just above the knee. This motion will push the back of the knee toward the floor. 3. For __________ seconds, keep the muscle as tight as you can without increasing your pain. 4. Relax the muscles slowly and completely. Repeat __________ times. Complete this exercise __________ times a day. Exercise F: Straight Leg Raises - Quadriceps 1. Lie on your back with your left / right leg extended and your other knee bent. 2. Tense the muscles in the front of your left / right thigh. You should see your kneecap slide up or see increased dimpling just above the knee. Your thigh may even shake a bit. 3. Keep these muscles tight as you raise your leg 4-6 inches (10-15 cm) off the floor. Do not let your knee bend. 4. Hold this position for __________ seconds. 5. Keep these muscles tense as you lower your leg. 6. Relax your muscles slowly and completely after each repetition. Repeat __________ times. Complete this exercise __________ times a day. Exercise G: Hamstring, Isometric 1. Lie on your back on a firm surface. 2. Bend your left / right knee approximately __________ degrees. 3. Dig your left / right heel into the surface as if you are trying to pull it toward your buttocks. Tighten the muscles in the back of your thighs to dig as hard as you can without increasing any pain. 4. Hold this position for __________ seconds. 5. Release the tension gradually and allow your muscles to relax completely for __________ seconds after each repetition. Repeat __________ times. Complete this exercise __________ times a day. Exercise H: Hamstring Curls  If told by your health care provider, do this exercise while wearing ankle weights. Begin with __________ weights. Then increase the weight by 1 lb (0.5 kg) increments.  Do not wear ankle weights that are more than __________. 1. Lie on your abdomen with your legs straight. 2. Bend your left / right knee as far as you can without feeling pain. Keep your hips flat against the floor. 3. Hold this position for __________ seconds. 4. Slowly lower your leg to the starting position.  Repeat __________ times. Complete this exercise __________ times a day. Exercise I: Squats (Quadriceps) 1. Stand in front of a table, with your feet and knees pointing straight ahead. You may rest your hands on the table for balance but not for support. 2. Slowly bend your knees and lower your hips like you are going to sit in a chair. ? Keep your weight over your heels, not over your toes. ? Keep your lower legs upright so they are parallel with the table legs. ? Do not let your hips go lower than your knees. ? Do not bend lower than told by your health care  provider. ? If your knee pain increases, do not bend as low. 3. Hold the squat position for __________ seconds. 4. Slowly push with your legs to return to standing. Do not use your hands to pull yourself to standing. Repeat __________ times. Complete this exercise __________ times a day. Exercise J: Wall Slides (Quadriceps)  1. Lean your back against a smooth wall or door while you walk your feet out 18-24 inches (46-61 cm) from it. 2. Place your feet hip-width apart. 3. Slowly slide down the wall or door until your knees bend __________ degrees. Keep your knees over your heels, not over your toes. Keep your knees in line with your hips. 4. Hold for __________ seconds. Repeat __________ times. Complete this exercise __________ times a day. Exercise K: Straight Leg Raises - Hip Abductors 1. Lie on your side with your left / right leg in the top position. Lie so your head, shoulder, knee, and hip line up. You may bend your bottom knee to help you keep your balance. 2. Roll your hips slightly forward so your hips are stacked  directly over each other and your left / right knee is facing forward. 3. Leading with your heel, lift your top leg 4-6 inches (10-15 cm). You should feel the muscles in your outer hip lifting. ? Do not let your foot drift forward. ? Do not let your knee roll toward the ceiling. 4. Hold this position for __________ seconds. 5. Slowly return your leg to the starting position. 6. Let your muscles relax completely after each repetition. Repeat __________ times. Complete this exercise __________ times a day. Exercise L: Straight Leg Raises - Hip Extensors 1. Lie on your abdomen on a firm surface. You can put a pillow under your hips if that is more comfortable. 2. Tense the muscles in your buttocks and lift your left / right leg about 4-6 inches (10-15 cm). Keep your knee straight as you lift your leg. 3. Hold this position for __________ seconds. 4. Slowly lower your leg to the starting position. 5. Let your leg relax completely after each repetition. Repeat __________ times. Complete this exercise __________ times a day. This information is not intended to replace advice given to you by your health care provider. Make sure you discuss any questions you have with your health care provider. Document Released: 09/02/2005 Document Revised: 07/13/2016 Document Reviewed: 08/25/2015 Elsevier Interactive Patient Education  2018 [employer].

## 2017-05-12 LAB — CBC WITH DIFFERENTIAL/PLATELET
Basophils Absolute: 0.1 10*3/uL (ref 0.0–0.2)
Basos: 1 %
EOS (ABSOLUTE): 0.1 10*3/uL (ref 0.0–0.4)
EOS: 1 %
HEMATOCRIT: 46.9 % — AB (ref 34.0–46.6)
Hemoglobin: 16 g/dL — ABNORMAL HIGH (ref 11.1–15.9)
IMMATURE GRANS (ABS): 0 10*3/uL (ref 0.0–0.1)
Immature Granulocytes: 0 %
LYMPHS: 26 %
Lymphocytes Absolute: 2.4 10*3/uL (ref 0.7–3.1)
MCH: 30.9 pg (ref 26.6–33.0)
MCHC: 34.1 g/dL (ref 31.5–35.7)
MCV: 91 fL (ref 79–97)
Monocytes Absolute: 0.8 10*3/uL (ref 0.1–0.9)
Monocytes: 8 %
NEUTROS ABS: 6 10*3/uL (ref 1.4–7.0)
Neutrophils: 64 %
PLATELETS: 208 10*3/uL (ref 150–379)
RBC: 5.17 x10E6/uL (ref 3.77–5.28)
RDW: 13.6 % (ref 12.3–15.4)
WBC: 9.4 10*3/uL (ref 3.4–10.8)

## 2017-05-12 LAB — LIPID PANEL
CHOL/HDL RATIO: 2.4 ratio (ref 0.0–4.4)
Cholesterol, Total: 198 mg/dL (ref 100–199)
HDL: 82 mg/dL (ref >39)
LDL CALC: 98 mg/dL (ref 0–99)
Triglycerides: 91 mg/dL (ref 0–149)
VLDL Cholesterol Cal: 18 mg/dL (ref 5–40)

## 2017-05-12 LAB — COMPREHENSIVE METABOLIC PANEL
A/G RATIO: 2.2 (ref 1.2–2.2)
ALBUMIN: 4.6 g/dL (ref 3.6–4.8)
ALT: 28 IU/L (ref 0–32)
AST: 30 IU/L (ref 0–40)
Alkaline Phosphatase: 70 IU/L (ref 39–117)
BILIRUBIN TOTAL: 0.6 mg/dL (ref 0.0–1.2)
BUN / CREAT RATIO: 14 (ref 12–28)
BUN: 12 mg/dL (ref 8–27)
CHLORIDE: 102 mmol/L (ref 96–106)
CO2: 23 mmol/L (ref 20–29)
Calcium: 9.5 mg/dL (ref 8.7–10.3)
Creatinine, Ser: 0.88 mg/dL (ref 0.57–1.00)
GFR calc non Af Amer: 67 mL/min/{1.73_m2} (ref >59)
GFR, EST AFRICAN AMERICAN: 78 mL/min/{1.73_m2} (ref >59)
GLOBULIN, TOTAL: 2.1 g/dL (ref 1.5–4.5)
Glucose: 105 mg/dL — ABNORMAL HIGH (ref 65–99)
Potassium: 4.3 mmol/L (ref 3.5–5.2)
SODIUM: 142 mmol/L (ref 134–144)
TOTAL PROTEIN: 6.7 g/dL (ref 6.0–8.5)

## 2017-05-12 LAB — HEMOGLOBIN A1C
Est. average glucose Bld gHb Est-mCnc: 114 mg/dL
Hgb A1c MFr Bld: 5.6 % (ref 4.8–5.6)

## 2017-09-10 ENCOUNTER — Other Ambulatory Visit: Payer: Self-pay | Admitting: Family Medicine

## 2017-09-25 ENCOUNTER — Other Ambulatory Visit: Payer: Self-pay | Admitting: Family Medicine

## 2017-11-16 ENCOUNTER — Encounter: Payer: Medicare Other | Admitting: Family Medicine

## 2017-11-22 ENCOUNTER — Ambulatory Visit (INDEPENDENT_AMBULATORY_CARE_PROVIDER_SITE_OTHER): Payer: Medicare Other | Admitting: Family Medicine

## 2017-11-22 ENCOUNTER — Encounter: Payer: Self-pay | Admitting: Family Medicine

## 2017-11-22 VITALS — BP 132/80 | HR 86 | Temp 97.8°F | Resp 16 | Ht 65.0 in | Wt 155.2 lb

## 2017-11-22 DIAGNOSIS — M1612 Unilateral primary osteoarthritis, left hip: Secondary | ICD-10-CM

## 2017-11-22 DIAGNOSIS — F17210 Nicotine dependence, cigarettes, uncomplicated: Secondary | ICD-10-CM

## 2017-11-22 DIAGNOSIS — Z1231 Encounter for screening mammogram for malignant neoplasm of breast: Secondary | ICD-10-CM | POA: Diagnosis not present

## 2017-11-22 DIAGNOSIS — Z8249 Family history of ischemic heart disease and other diseases of the circulatory system: Secondary | ICD-10-CM

## 2017-11-22 DIAGNOSIS — E7800 Pure hypercholesterolemia, unspecified: Secondary | ICD-10-CM | POA: Diagnosis not present

## 2017-11-22 DIAGNOSIS — I10 Essential (primary) hypertension: Secondary | ICD-10-CM

## 2017-11-22 DIAGNOSIS — Z23 Encounter for immunization: Secondary | ICD-10-CM

## 2017-11-22 DIAGNOSIS — D049 Carcinoma in situ of skin, unspecified: Secondary | ICD-10-CM | POA: Diagnosis not present

## 2017-11-22 DIAGNOSIS — Z720 Tobacco use: Secondary | ICD-10-CM | POA: Diagnosis not present

## 2017-11-22 DIAGNOSIS — E2839 Other primary ovarian failure: Secondary | ICD-10-CM | POA: Diagnosis not present

## 2017-11-22 DIAGNOSIS — Z1211 Encounter for screening for malignant neoplasm of colon: Secondary | ICD-10-CM

## 2017-11-22 DIAGNOSIS — Z0000 Encounter for general adult medical examination without abnormal findings: Secondary | ICD-10-CM | POA: Diagnosis not present

## 2017-11-22 LAB — POCT URINALYSIS DIP (MANUAL ENTRY)
BILIRUBIN UA: NEGATIVE
BILIRUBIN UA: NEGATIVE mg/dL
Glucose, UA: NEGATIVE mg/dL
Leukocytes, UA: NEGATIVE
Nitrite, UA: NEGATIVE
PH UA: 5.5 (ref 5.0–8.0)
PROTEIN UA: NEGATIVE mg/dL
Spec Grav, UA: 1.025 (ref 1.010–1.025)
Urobilinogen, UA: 0.2 E.U./dL

## 2017-11-22 MED ORDER — IBUPROFEN 800 MG PO TABS: 800.0000 mg | ORAL_TABLET | Freq: Three times a day (TID) | ORAL | 1 refills | Status: DC | PRN

## 2017-11-22 MED ORDER — AMLODIPINE-ATORVASTATIN 5-10 MG PO TABS: 1.0000 | ORAL_TABLET | Freq: Every day | ORAL | 1 refills | Status: DC

## 2017-11-22 NOTE — Patient Instructions (Addendum)
   IF you received an x-ray today, you will receive an invoice from [facility]. Please contact [facility] at [phone] with questions or concerns regarding your invoice.   IF you received labwork today, you will receive an invoice from [facility]. Please contact [facility] at [phone] with questions or concerns regarding your invoice.   Our billing staff will not be able to assist you with questions regarding bills from these companies.  You will be contacted with the lab results as soon as they are available. The fastest way to get your results is to activate your My Chart account. Instructions are located on the last page of this paperwork. If you have not heard from us regarding the results in 2 weeks, please contact this office.      Preventive Care 70 Years and Older, Female Preventive care refers to lifestyle choices and visits with your health care provider that can promote health and wellness. What does preventive care include?  A yearly physical exam. This is also called an annual well check.  Dental exams once or twice a year.  Routine eye exams. Ask your health care provider how often you should have your eyes checked.  Personal lifestyle choices, including: ? Daily care of your teeth and gums. ? Regular physical activity. ? Eating a healthy diet. ? Avoiding tobacco and drug use. ? Limiting alcohol use. ? Practicing safe sex. ? Taking low-dose aspirin every day. ? Taking vitamin and mineral supplements as recommended by your health care provider. What happens during an annual well check? The services and screenings done by your health care provider during your annual well check will depend on your age, overall health, lifestyle risk factors, and family history of disease. Counseling Your health care provider may ask you questions about your:  Alcohol use.  Tobacco use.  Drug use.  Emotional well-being.  Home and relationship  well-being.  Sexual activity.  Eating habits.  History of falls.  Memory and ability to understand (cognition).  Work and work environment.  Reproductive health.  Screening You may have the following tests or measurements:  Height, weight, and BMI.  Blood pressure.  Lipid and cholesterol levels. These may be checked every 5 years, or more frequently if you are over 50 years old.  Skin check.  Lung cancer screening. You may have this screening every year starting at age 55 if you have a 30-pack-year history of smoking and currently smoke or have quit within the past 15 years.  Fecal occult blood test (FOBT) of the stool. You may have this test every year starting at age 50.  Flexible sigmoidoscopy or colonoscopy. You may have a sigmoidoscopy every 5 years or a colonoscopy every 10 years starting at age 50.  Hepatitis C blood test.  Hepatitis B blood test.  Sexually transmitted disease (STD) testing.  Diabetes screening. This is done by checking your blood sugar (glucose) after you have not eaten for a while (fasting). You may have this done every 1-3 years.  Bone density scan. This is done to screen for osteoporosis. You may have this done starting at age 65.  Mammogram. This may be done every 1-2 years. Talk to your health care provider about how often you should have regular mammograms.  Talk with your health care provider about your test results, treatment options, and if necessary, the need for more tests. Vaccines Your health care provider may recommend certain vaccines, such as:  Influenza vaccine. This is recommended every year.    Tetanus, diphtheria, and acellular pertussis (Tdap, Td) vaccine. You may need a Td booster every 10 years.  Varicella vaccine. You may need this if you have not been vaccinated.  Zoster vaccine. You may need this after age 60.  Measles, mumps, and rubella (MMR) vaccine. You may need at least one dose of MMR if you were born in  1957 or later. You may also need a second dose.  Pneumococcal 13-valent conjugate (PCV13) vaccine. One dose is recommended after age 65.  Pneumococcal polysaccharide (PPSV23) vaccine. One dose is recommended after age 65.  Meningococcal vaccine. You may need this if you have certain conditions.  Hepatitis A vaccine. You may need this if you have certain conditions or if you travel or work in places where you may be exposed to hepatitis A.  Hepatitis B vaccine. You may need this if you have certain conditions or if you travel or work in places where you may be exposed to hepatitis B.  Haemophilus influenzae type b (Hib) vaccine. You may need this if you have certain conditions.  Talk to your health care provider about which screenings and vaccines you need and how often you need them. This information is not intended to replace advice given to you by your health care provider. Make sure you discuss any questions you have with your health care provider. Document Released: 11/15/2015 Document Revised: 07/08/2016 Document Reviewed: 08/20/2015 Elsevier Interactive Patient Education  2018 [employer]  

## 2017-11-22 NOTE — Progress Notes (Signed)
Subjective:    Patient ID: Kelly Merritt, female    DOB: May 18, 1946, 72 y.o.   MRN: [id number]  11/22/2017  Annual Exam    HPI This 72 y.o. female presents for [facility] and follow-up of chronic medical condition including hypertension, hypercholesterolemia.  Last physical:  08-12-2015 Pap smear:  08-2014 Mammogram:  2015 Colonoscopy:  never Bone density:  never   Tobacco abuse: continues to smoke half a pack a day.  Husband also continues to smoke despite COPD and coronary artery disease.    Joint pains:  Patient usually takes 1 ibuprofen 800 mg at bedtime.  She may also need another ibuprofen earlier in the day.  She describes pain all over.   Visual Acuity Screening   Right eye Left eye Both eyes  Without correction: 20/40 20/30 20/30   With correction:       BP Readings from Last 3 Encounters:  11/22/17 132/80  05/11/17 129/84  11/04/16 128/82   Wt Readings from Last 3 Encounters:  11/22/17 155 lb 3.2 oz (70.4 kg)  05/11/17 161 lb (73 kg)  11/04/16 153 lb (69.4 kg)   Immunization History  Administered Date(s) Administered  . Pneumococcal-Unspecified 06/20/2009  . Td 09/21/2007  . Tdap 07/09/2011   Health Maintenance  Topic Date Due  . COLONOSCOPY  04/01/1998  . DEXA SCAN  04/01/2013  . PNA vac Low Risk Adult (1 of 2 - PCV13) 04/01/2013  . MAMMOGRAM  09/07/2016  . INFLUENZA VACCINE  06/02/2017  . TETANUS/TDAP  07/08/2021  . Hepatitis C Screening  Completed   Hypertension: Patient reports good compliance with medication, good tolerance to medication, and good symptom control.    Hypercholesterolemia: Patient reports good compliance with medication, good tolerance to medication, and good symptom control.    Son 67 year old had an AMI; also DMII, hypercholesterolemia, obesity, gout.   Review of Systems  Constitutional: Negative for activity change, appetite change, chills, diaphoresis, fatigue, fever and unexpected weight change.    HENT: Negative for congestion, dental problem, drooling, ear discharge, ear pain, facial swelling, hearing loss, mouth sores, nosebleeds, postnasal drip, rhinorrhea, sinus pressure, sneezing, sore throat, tinnitus, trouble swallowing and voice change.   Eyes: Negative for photophobia, pain, discharge, redness, itching and visual disturbance.  Respiratory: Positive for cough. Negative for apnea, choking, chest tightness, shortness of breath, wheezing and stridor.   Cardiovascular: Negative for chest pain, palpitations and leg swelling.  Gastrointestinal: Negative for abdominal distention, abdominal pain, anal bleeding, blood in stool, constipation, diarrhea, nausea, rectal pain and vomiting.  Endocrine: Negative for cold intolerance, heat intolerance, polydipsia, polyphagia and polyuria.  Genitourinary: Negative for decreased urine volume, difficulty urinating, dyspareunia, dysuria, enuresis, flank pain, frequency, genital sores, hematuria, menstrual problem, pelvic pain, urgency, vaginal bleeding, vaginal discharge and vaginal pain.  Musculoskeletal: Positive for arthralgias and back pain. Negative for gait problem, joint swelling, myalgias, neck pain and neck stiffness.  Skin: Negative for color change, pallor, rash and wound.  Allergic/Immunologic: Negative for environmental allergies, food allergies and immunocompromised state.  Neurological: Negative for dizziness, tremors, seizures, syncope, facial asymmetry, speech difficulty, weakness, light-headedness, numbness and headaches.  Hematological: Negative for adenopathy. Does not bruise/bleed easily.  Psychiatric/Behavioral: Negative for agitation, behavioral problems, confusion, decreased concentration, dysphoric mood, hallucinations, self-injury, sleep disturbance and suicidal ideas. The patient is not nervous/anxious and is not hyperactive.     Past Medical History:  Diagnosis Date  . Allergic rhinitis, cause unspecified   . Allergy   .  Benign neoplasm  of other specified sites of skin    Squamous Cell Carcinoma abdomen; [facility]/derm  . Cancer ([place])    Squamous Cell Carcinoma; followed annually by [facility]  . Depression   . Essential hypertension, benign   . Gastric ulcer, unspecified as acute or chronic, without mention of hemorrhage, perforation, or obstruction   . Headache(784.0)   . Migraine, unspecified, without mention of intractable migraine without mention of status migrainosus   . Mitral valve disorders(424.0)   . Osteoarthrosis, unspecified whether generalized or localized, lower leg    Severe OA hip.  . Other abnormal glucose   . Pure hypercholesterolemia   . Substance abuse ([place])    CIGARETTES  . Symptomatic menopausal or female climacteric states   . Tobacco use disorder    Past Surgical History:  Procedure Laterality Date  . BREAST BIOPSY  1970  . CARDIAC CATHETERIZATION  10/2008  . DILATION AND CURETTAGE OF UTERUS  1998   heavy menses  . JOINT REPLACEMENT  12/03/2012   L THR  . resection of squamous cell carcinoma forearm  2013   abdomen 2015  . TONSILLECTOMY AND ADENOIDECTOMY  1950   Allergies  Allergen Reactions  . Indomethacin Other (See Comments)    SEVERE STOMACH CRAMPS AND BLOOD IN URINE  . Lipitor [Atorvastatin] Other (See Comments)    myalgias  . Niacin And Related    Current Outpatient Medications on File Prior to Visit  Medication Sig Dispense Refill  . aspirin 81 MG tablet Take 81 mg by mouth daily.    . calcium citrate (CALCITRATE - DOSED IN MG ELEMENTAL CALCIUM) 950 MG tablet Take 1 tablet (200 mg of elemental calcium total) by mouth daily. 1 tablet 0  . Glucosamine-Chondroitin 500-400 MG CAPS Take by mouth.    . Multiple Vitamin (MULTIVITAMINS PO) Take by mouth daily.    . OMEGA 3 1000 MG CAPS Take 2 capsules by mouth daily.    . vitamin C (ASCORBIC ACID) 250 MG tablet Take 250 mg by mouth daily.     No current facility-administered medications on file prior to  visit.    Social History   Socioeconomic History  . Marital status: Married    Spouse name: Not on file  . Number of children: 3  . Years of education: college  . Highest education level: Not on file  Social Needs  . Financial resource strain: Not on file  . Food insecurity - worry: Not on file  . Food insecurity - inability: Not on file  . Transportation needs - medical: Not on file  . Transportation needs - non-medical: Not on file  Occupational History  . Occupation: retired    Comment: Pharmacist, hospital  Tobacco Use  . Smoking status: Current Every Day Smoker    Years: 30.00    Types: Cigarettes    Start date: 11/02/1966    Last attempt to quit: 12/03/2014    Years since quitting: 2.9  . Smokeless tobacco: Never Used  . Tobacco comment: LESS THAN 1/2 PACK-per patient RESTARTED 02/2015  Substance and Sexual Activity  . Alcohol use: No    Alcohol/week: 0.0 oz    Comment: minimal  . Drug use: No  . Sexual activity: Yes    Birth control/protection: Post-menopausal  Other Topics Concern  . Not on file  Social History Narrative      Marital status:  Married x 49 years. Moderately happy, verbal abuse only at times, denies physical abuse.  Children: 3 children, 7 grandchildren.        Lives: with husband, 53 yo son, 75 yo grandson, 2 dogs.      Employment: retired from school system.  Taught school.   Taking care of granddaughter two days per week in 2019.      Tobacco abuse:  1/2-1 ppd.         Alcohol: weekends rarely.         Exercise: none      Always uses seat belts.         Smoke alarm and carbon monoxide detector in the home.       ADLs: independent with ADLs; no assistant devices.         Advanced Directives:  NONE in 2019.                    Family History  Problem Relation Age of Onset  . Alcohol abuse Father   . Drug abuse Father   . Depression Father   . Cirrhosis Father   . Gout Father   . CAD Father        PTSD  . Heart disease Father 75        AMI/CABG  . Dementia Mother   . Hyperlipidemia Mother   . Hypertension Sister   . Hyperlipidemia Sister   . Leukemia Sister   . Hyperlipidemia Sister   . Hypertension Sister   . Heart disease Brother   . Hyperlipidemia Brother   . Hypertension Brother   . COPD Brother   . COPD Brother   . Cancer Sister        BOWEL  . Hyperlipidemia Sister   . Hypertension Sister   . Hyperlipidemia Sister   . Hypertension Sister   . Ovarian cancer Unknown   . Breast cancer Unknown        Objective:    BP 132/80 (BP Location: Left Arm, Patient Position: Sitting, Cuff Size: Normal)   Pulse 86   Temp 97.8 F (36.6 C) (Oral)   Resp 16   Ht 5\' 5"  (1.651 m)   Wt 155 lb 3.2 oz (70.4 kg)   SpO2 94%   BMI 25.83 kg/m  Physical Exam  Constitutional: She is oriented to person, place, and time. She appears well-developed and well-nourished. No distress.  HENT:  Head: Normocephalic and atraumatic.  Right Ear: External ear normal.  Left Ear: External ear normal.  Nose: Nose normal.  Mouth/Throat: Oropharynx is clear and moist.  Eyes: Conjunctivae and EOM are normal. Pupils are equal, round, and reactive to light.  Neck: Normal range of motion and full passive range of motion without pain. Neck supple. No JVD present. Carotid bruit is not present. No thyromegaly present.  Cardiovascular: Normal rate, regular rhythm and normal heart sounds. Exam reveals no gallop and no friction rub.  No murmur heard. Pulmonary/Chest: Effort normal and breath sounds normal. She has no wheezes. She has no rales. Right breast exhibits no inverted nipple, no mass, no nipple discharge, no skin change and no tenderness. Left breast exhibits no inverted nipple, no mass, no nipple discharge, no skin change and no tenderness. Breasts are symmetrical.  Abdominal: Soft. Bowel sounds are normal. She exhibits no distension and no mass. There is no tenderness. There is no rebound and no guarding.  Musculoskeletal:       Right  shoulder: Normal.       Left shoulder: Normal.       Cervical back:  Normal.  Lymphadenopathy:    She has no cervical adenopathy.  Neurological: She is alert and oriented to person, place, and time. She has normal reflexes. No cranial nerve deficit. She exhibits normal muscle tone. Coordination normal.  Skin: Skin is warm and dry. No rash noted. She is not diaphoretic. No erythema. No pallor.  Psychiatric: She has a normal mood and affect. Her behavior is normal. Judgment and thought content normal.  Nursing note and vitals reviewed.  No results found. Depression screen [facility] 2/9 11/22/2017 05/11/2017 11/04/2016 03/03/2016 08/12/2015  Decreased Interest 0 0 0 0 0  Down, Depressed, Hopeless 0 0 0 0 0  PHQ - 2 Score 0 0 0 0 0  Altered sleeping - - - - -  Tired, decreased energy - - - - -  Change in appetite - - - - -  Feeling bad or failure about yourself  - - - - -  Trouble concentrating - - - - -  Moving slowly or fidgety/restless - - - - -  PHQ-9 Score - - - - -   Fall Risk  11/22/2017 05/11/2017 11/04/2016 03/03/2016 08/12/2015  Falls in the past year? No No No No Yes  Number falls in past yr: - - - - 1  Injury with Fall? - - - - No    Functional Status Survey: Is the patient deaf or have difficulty hearing?: No Does the patient have difficulty seeing, even when wearing glasses/contacts?: No Does the patient have difficulty concentrating, remembering, or making decisions?: No Does the patient have difficulty walking or climbing stairs?: No Does the patient have difficulty dressing or bathing?: No Does the patient have difficulty doing errands alone such as visiting a doctor's office or shopping?: No     Assessment & Plan:   1. Encounter for Medicare annual wellness exam   2. Essential hypertension   3. Pure hypercholesterolemia   4. Estrogen deficiency   5. Need for prophylactic vaccination and inoculation against influenza   6. Colon cancer screening   7. Encounter for screening  mammogram for breast cancer   8. Squamous cell carcinoma in situ of skin   9. Family history of coronary artery disease   10. Tobacco abuse   11. Primary osteoarthritis of left hip     -anticipatory guidance provided --- exercise, weight loss, safe driving practices, aspirin 81mg  daily. -obtain age appropriate screening labs and labs for chronic disease management. -moderate fall risk; no evidence of depression; no evidence of hearing loss.  Discussed advanced directives and living will; also discussed end of life issues including code status.  -Colon cancer screening: Patient refuses colonoscopy but agreeable to color guard. -Osteoporosis screening: Patient refuses bone density scan at this time. -Breast cancer screening: Patient agreeable to undergoing mammography every 2 years. -Patient refuses Prevnar 13 and shingles vaccine.  Agreeable to flu vaccine.   -Patient pre-contemplative regarding smoking cessation.  Counseled patient for 3 minutes regarding smoking cessation. -No longer warrants Pap smears due to age greater than 48.   -Hypertension hypercholesterolemia well controlled.  Refills provided without changes.   -I advised patient the risk of daily frequent doses of ibuprofen therapy including stomach ulcers and negative impacts on kidney function.  Orders Placed This Encounter  Procedures  . MM Digital Screening    Standing Status:   Future    Standing Expiration Date:   01/23/2019    Order Specific Question:   Reason for Exam (SYMPTOM  OR DIAGNOSIS REQUIRED)  Answer:   annual screening    Order Specific Question:   Preferred imaging location?    Answer:   [facility]  . CBC with Differential/Platelet  . Comprehensive metabolic panel    Order Specific Question:   Has the patient fasted?    Answer:   No  . Lipid panel    Order Specific Question:   Has the patient fasted?    Answer:   No  . Cologuard  . POCT urinalysis dipstick  . EKG 12-Lead   Meds ordered this  encounter  Medications  . amLODipine-atorvastatin (CADUET) 5-10 MG tablet    Sig: Take 1 tablet by mouth daily.    Dispense:  90 tablet    Refill:  1    Generic please.  Marland Kitchen ibuprofen (ADVIL,MOTRIN) 800 MG tablet    Sig: Take 1 tablet (800 mg total) by mouth every 8 (eight) hours as needed.    Dispense:  90 tablet    Refill:  1    Please consider 90 day supplies to promote better adherence    Return in about 6 months (around 05/22/2018) for follow-up chronic medical conditions.   Zaila Crew Elayne Guerin, M.D. Primary Care at [facility] [address] [place],   [address] [phone] phone [phone] fax

## 2017-11-23 LAB — CBC WITH DIFFERENTIAL/PLATELET
Basophils Absolute: 0.1 10*3/uL (ref 0.0–0.2)
Basos: 1 %
EOS (ABSOLUTE): 0.2 10*3/uL (ref 0.0–0.4)
EOS: 2 %
HEMATOCRIT: 46.3 % (ref 34.0–46.6)
HEMOGLOBIN: 16.5 g/dL — AB (ref 11.1–15.9)
IMMATURE GRANULOCYTES: 0 %
Immature Grans (Abs): 0 10*3/uL (ref 0.0–0.1)
LYMPHS: 38 %
Lymphocytes Absolute: 2.7 10*3/uL (ref 0.7–3.1)
MCH: 32.8 pg (ref 26.6–33.0)
MCHC: 35.6 g/dL (ref 31.5–35.7)
MCV: 92 fL (ref 79–97)
MONOCYTES: 8 %
Monocytes Absolute: 0.5 10*3/uL (ref 0.1–0.9)
NEUTROS ABS: 3.6 10*3/uL (ref 1.4–7.0)
NEUTROS PCT: 51 %
PLATELETS: 217 10*3/uL (ref 150–379)
RBC: 5.03 x10E6/uL (ref 3.77–5.28)
RDW: 13.2 % (ref 12.3–15.4)
WBC: 7 10*3/uL (ref 3.4–10.8)

## 2017-11-23 LAB — LIPID PANEL
CHOL/HDL RATIO: 2.3 ratio (ref 0.0–4.4)
Cholesterol, Total: 203 mg/dL — ABNORMAL HIGH (ref 100–199)
HDL: 87 mg/dL (ref >39)
LDL Calculated: 99 mg/dL (ref 0–99)
Triglycerides: 84 mg/dL (ref 0–149)
VLDL Cholesterol Cal: 17 mg/dL (ref 5–40)

## 2017-11-23 LAB — COMPREHENSIVE METABOLIC PANEL
A/G RATIO: 2.2 (ref 1.2–2.2)
ALBUMIN: 4.8 g/dL (ref 3.6–4.8)
ALT: 23 IU/L (ref 0–32)
AST: 28 IU/L (ref 0–40)
Alkaline Phosphatase: 72 IU/L (ref 39–117)
BILIRUBIN TOTAL: 0.6 mg/dL (ref 0.0–1.2)
BUN / CREAT RATIO: 14 (ref 12–28)
BUN: 13 mg/dL (ref 8–27)
CALCIUM: 10 mg/dL (ref 8.7–10.3)
CO2: 23 mmol/L (ref 20–29)
Chloride: 102 mmol/L (ref 96–106)
Creatinine, Ser: 0.95 mg/dL (ref 0.57–1.00)
GFR, EST AFRICAN AMERICAN: 71 mL/min/{1.73_m2} (ref >59)
GFR, EST NON AFRICAN AMERICAN: 61 mL/min/{1.73_m2} (ref >59)
GLOBULIN, TOTAL: 2.2 g/dL (ref 1.5–4.5)
Glucose: 100 mg/dL — ABNORMAL HIGH (ref 65–99)
POTASSIUM: 4.1 mmol/L (ref 3.5–5.2)
Sodium: 143 mmol/L (ref 134–144)
TOTAL PROTEIN: 7 g/dL (ref 6.0–8.5)

## 2018-03-29 ENCOUNTER — Encounter: Payer: Self-pay | Admitting: Family Medicine

## 2018-05-23 ENCOUNTER — Ambulatory Visit (INDEPENDENT_AMBULATORY_CARE_PROVIDER_SITE_OTHER): Payer: Medicare Other | Admitting: Family Medicine

## 2018-05-23 ENCOUNTER — Other Ambulatory Visit: Payer: Self-pay

## 2018-05-23 ENCOUNTER — Encounter: Payer: Self-pay | Admitting: Family Medicine

## 2018-05-23 VITALS — BP 126/72 | HR 85 | Temp 98.1°F | Resp 16 | Ht 66.14 in | Wt 157.2 lb

## 2018-05-23 DIAGNOSIS — Z8249 Family history of ischemic heart disease and other diseases of the circulatory system: Secondary | ICD-10-CM | POA: Diagnosis not present

## 2018-05-23 DIAGNOSIS — K0510 Chronic gingivitis, plaque induced: Secondary | ICD-10-CM

## 2018-05-23 DIAGNOSIS — M1612 Unilateral primary osteoarthritis, left hip: Secondary | ICD-10-CM | POA: Diagnosis not present

## 2018-05-23 DIAGNOSIS — R3129 Other microscopic hematuria: Secondary | ICD-10-CM | POA: Diagnosis not present

## 2018-05-23 DIAGNOSIS — I10 Essential (primary) hypertension: Secondary | ICD-10-CM | POA: Diagnosis not present

## 2018-05-23 DIAGNOSIS — E7800 Pure hypercholesterolemia, unspecified: Secondary | ICD-10-CM

## 2018-05-23 DIAGNOSIS — D049 Carcinoma in situ of skin, unspecified: Secondary | ICD-10-CM | POA: Diagnosis not present

## 2018-05-23 DIAGNOSIS — Z720 Tobacco use: Secondary | ICD-10-CM

## 2018-05-23 LAB — POCT URINALYSIS DIP (MANUAL ENTRY)
Bilirubin, UA: NEGATIVE
Glucose, UA: NEGATIVE mg/dL
Ketones, POC UA: NEGATIVE mg/dL
LEUKOCYTES UA: NEGATIVE
NITRITE UA: NEGATIVE
PH UA: 5 (ref 5.0–8.0)
PROTEIN UA: NEGATIVE mg/dL
Spec Grav, UA: 1.02 (ref 1.010–1.025)
Urobilinogen, UA: 0.2 E.U./dL

## 2018-05-23 MED ORDER — AMOXICILLIN 500 MG PO TABS: 1000.0000 mg | ORAL_TABLET | Freq: Two times a day (BID) | ORAL | 0 refills | Status: DC

## 2018-05-23 MED ORDER — AMLODIPINE-ATORVASTATIN 5-10 MG PO TABS: 1.0000 | ORAL_TABLET | Freq: Every day | ORAL | 1 refills | Status: DC

## 2018-05-23 MED ORDER — IBUPROFEN 800 MG PO TABS: 800.0000 mg | ORAL_TABLET | Freq: Three times a day (TID) | ORAL | 1 refills | Status: DC | PRN

## 2018-05-23 NOTE — Patient Instructions (Addendum)
  [facility]  [place], [place] [address]   Phone number: [phone]      We recommend that you schedule a mammogram for breast cancer screening. Typically, you do not need a referral to do this. Please contact a local imaging center to schedule your mammogram.  [facility] - [phone]  *ask for the Radiology Department [facility] - [phone] or [phone]  [facility] - [phone] [place] [phone] [facility] - [phone]  *ask for the [facility] - [phone]  *ask for the Radiology Department [facility] - [phone]  *ask for the [facility] - [phone]   IF you received an x-ray today, you will receive an invoice from [facility]. Please contact [facility] at [phone] with questions or concerns regarding your invoice.   IF you received labwork today, you will receive an invoice from [facility]. Please contact [facility] at [phone] with questions or concerns regarding your invoice.   Our billing staff will not be able to assist you with questions regarding bills from these companies.  You will be contacted with the lab results as soon as they are available. The fastest way to get your results is to activate your My Chart account. Instructions are located on the last page of this paperwork. If you have not heard from [place] regarding the results in 2 weeks, please contact this office.

## 2018-05-23 NOTE — Progress Notes (Signed)
Subjective:    Patient ID: Kelly Merritt, female    DOB: 1946-09-16, 72 y.o.   MRN: [id number]  05/23/2018  Chronic Conditions (6 month follow-up )    HPI This 72 y.o. female presents for evaluation of hypertension and hypercholesterolemia. Management changes made at last visit include the following: obtain age appropriate screening labs and labs for chronic disease management. -moderate fall risk; no evidence of depression; no evidence of hearing loss.  Discussed advanced directives and living will; also discussed end of life issues including code status.  -Colon cancer screening: Patient refuses colonoscopy but agreeable to color guard. -Osteoporosis screening: Patient refuses bone density scan at this time. -Breast cancer screening: Patient agreeable to undergoing mammography every 2 years. -Patient refuses Prevnar 13 and shingles vaccine.  Agreeable to flu vaccine.   -Patient pre-contemplative regarding smoking cessation.  Counseled patient for 3 minutes regarding smoking cessation. -No longer warrants Pap smears due to age greater than 70.   -Hypertension hypercholesterolemia well controlled.  Refills provided without changes.   -I advised patient the risk of daily frequent doses of ibuprofen therapy including stomach ulcers and negative impacts on kidney function. No evidence of anemia. Hemoglobin remains slightly elevated at 16.5 due to chronic smoking. Recommend smoking cessation. Glucose/sugar is borderline at 100. I recommend limiting sugar and carbohydrate intake. Liver and kidney functions are normal. Cholesterol is under good control. Urine shows a small amount of blood present. I recommend repeating a urine evaluation at your next visit in 6 months. EKG is normal. Great to see you is always! Thank you for trusting me with your medical care. UPDATE: Sonia Baller is fine; working a lot of hours; keeping granddaughters daily.   Gum infection right now; not wearing night  guard regularly; clenching teeth.  Gums are swollen and tender; bleeding.  Glands are tender.  Will go away.   Fatigue; not getting enough exercise.  Did lay mulch in the spring. Laziness interferes with exercise.  Babysitting regularly.  Able to clean downstairs; now can get to treadmill.    Knees/wrists/fingers hurt.  Taking Ibuprofen once every morning.    Has not undergone mammogram yet agreeable.  Unable to quit smoking due to marital stressors and family stressors; husband also smokes.  Atypical chest pain; sharp shooting chest pain that lasts only seconds; no associated diaphoresis, SOB, nausea, radiation.   BP Readings from Last 3 Encounters:  05/23/18 126/72  11/22/17 132/80  05/11/17 129/84   Wt Readings from Last 3 Encounters:  05/23/18 157 lb 3.2 oz (71.3 kg)  11/22/17 155 lb 3.2 oz (70.4 kg)  05/11/17 161 lb (73 kg)   Immunization History  Administered Date(s) Administered  . Pneumococcal-Unspecified 06/20/2009  . Td 09/21/2007  . Tdap 07/09/2011    Review of Systems  Constitutional: Negative for activity change, appetite change, chills, diaphoresis, fatigue, fever and unexpected weight change.  HENT: Positive for dental problem. Negative for congestion, drooling, ear discharge, ear pain, facial swelling, hearing loss, mouth sores, nosebleeds, postnasal drip, rhinorrhea, sinus pressure, sinus pain, sneezing, sore throat, tinnitus, trouble swallowing and voice change.   Eyes: Negative for photophobia, pain, discharge, redness, itching and visual disturbance.  Respiratory: Negative for apnea, cough, choking, chest tightness, shortness of breath, wheezing and stridor.   Cardiovascular: Positive for chest pain. Negative for palpitations and leg swelling.  Gastrointestinal: Negative for abdominal distention, abdominal pain, anal bleeding, blood in stool, constipation, diarrhea, nausea, rectal pain and vomiting.  Endocrine: Negative for cold intolerance, heat intolerance,  polydipsia, polyphagia and polyuria.  Genitourinary: Negative for decreased urine volume, difficulty urinating, dyspareunia, dysuria, enuresis, flank pain, frequency, genital sores, hematuria, menstrual problem, pelvic pain, urgency, vaginal bleeding, vaginal discharge and vaginal pain.  Musculoskeletal: Positive for arthralgias. Negative for back pain, gait problem, joint swelling, myalgias, neck pain and neck stiffness.  Skin: Negative for color change, pallor, rash and wound.  Allergic/Immunologic: Negative for environmental allergies, food allergies and immunocompromised state.  Neurological: Negative for dizziness, tremors, seizures, syncope, facial asymmetry, speech difficulty, weakness, light-headedness, numbness and headaches.  Hematological: Negative for adenopathy. Does not bruise/bleed easily.  Psychiatric/Behavioral: Negative for agitation, behavioral problems, confusion, decreased concentration, dysphoric mood, hallucinations, self-injury, sleep disturbance and suicidal ideas. The patient is not nervous/anxious and is not hyperactive.     Past Medical History:  Diagnosis Date  . Allergic rhinitis, cause unspecified   . Allergy   . Benign neoplasm of other specified sites of skin    Squamous Cell Carcinoma abdomen; [facility]/derm  . Cancer ([place])    Squamous Cell Carcinoma; followed annually by [facility]/[place]  . Depression   . Essential hypertension, benign   . Gastric ulcer, unspecified as acute or chronic, without mention of hemorrhage, perforation, or obstruction   . Headache(784.0)   . Migraine, unspecified, without mention of intractable migraine without mention of status migrainosus   . Mitral valve disorders(424.0)   . Osteoarthrosis, unspecified whether generalized or localized, lower leg    Severe OA hip.  . Other abnormal glucose   . Pure hypercholesterolemia   . Substance abuse ([place])    CIGARETTES  . Symptomatic menopausal or female climacteric states   .  Tobacco use disorder    Past Surgical History:  Procedure Laterality Date  . BREAST BIOPSY  1970  . CARDIAC CATHETERIZATION  10/2008  . DILATION AND CURETTAGE OF UTERUS  1998   heavy menses  . JOINT REPLACEMENT  12/03/2012   L THR  . resection of squamous cell carcinoma forearm  2013   abdomen 2015  . TONSILLECTOMY AND ADENOIDECTOMY  1950   Allergies  Allergen Reactions  . Indomethacin Other (See Comments)    SEVERE STOMACH CRAMPS AND BLOOD IN URINE  . Lipitor [Atorvastatin] Other (See Comments)    myalgias  . Niacin And Related    Current Outpatient Medications on File Prior to Visit  Medication Sig Dispense Refill  . aspirin 81 MG tablet Take 81 mg by mouth daily.    . calcium citrate (CALCITRATE - DOSED IN MG ELEMENTAL CALCIUM) 950 MG tablet Take 1 tablet (200 mg of elemental calcium total) by mouth daily. 1 tablet 0  . Glucosamine-Chondroitin 500-400 MG CAPS Take by mouth.    . Multiple Vitamin (MULTIVITAMINS PO) Take by mouth daily.    . OMEGA 3 1000 MG CAPS Take 2 capsules by mouth daily.    . vitamin C (ASCORBIC ACID) 250 MG tablet Take 250 mg by mouth daily.     No current facility-administered medications on file prior to visit.    Social History   Socioeconomic History  . Marital status: Married    Spouse name: Not on file  . Number of children: 3  . Years of education: college  . Highest education level: Not on file  Occupational History  . Occupation: retired    Comment: Pharmacist, hospital  Social Needs  . Financial resource strain: Not on file  . Food insecurity:    Worry: Not on file    Inability: Not on file  . Transportation needs:  Medical: Not on file    Non-medical: Not on file  Tobacco Use  . Smoking status: Current Every Day Smoker    Years: 30.00    Types: Cigarettes    Start date: 11/02/1966    Last attempt to quit: 12/03/2014    Years since quitting: 3.4  . Smokeless tobacco: Never Used  . Tobacco comment: LESS THAN 1/2 PACK-per patient RESTARTED  02/2015  Substance and Sexual Activity  . Alcohol use: No    Alcohol/week: 0.0 oz    Comment: minimal  . Drug use: No  . Sexual activity: Yes    Birth control/protection: Post-menopausal  Lifestyle  . Physical activity:    Days per week: Not on file    Minutes per session: Not on file  . Stress: Not on file  Relationships  . Social connections:    Talks on phone: Not on file    Gets together: Not on file    Attends religious service: Not on file    Active member of club or organization: Not on file    Attends meetings of clubs or organizations: Not on file    Relationship status: Not on file  . Intimate partner violence:    Fear of current or ex partner: Not on file    Emotionally abused: Not on file    Physically abused: Not on file    Forced sexual activity: Not on file  Other Topics Concern  . Not on file  Social History Narrative      Marital status:  Married x 49 years. Moderately happy, verbal abuse only at times, denies physical abuse.         Children: 3 children, 7 grandchildren.        Lives: with husband, 12 yo son, 36 yo grandson, 2 dogs.      Employment: retired from school system.  Taught school.   Taking care of granddaughter two days per week in 2019.      Tobacco abuse:  1/2-1 ppd.         Alcohol: weekends rarely.         Exercise: none      Always uses seat belts.         Smoke alarm and carbon monoxide detector in the home.       ADLs: independent with ADLs; no assistant devices.         Advanced Directives:  NONE in 2019.                    Family History  Problem Relation Age of Onset  . Alcohol abuse Father   . Drug abuse Father   . Depression Father   . Cirrhosis Father   . Gout Father   . CAD Father        PTSD  . Heart disease Father 83       AMI/CABG  . Dementia Mother   . Hyperlipidemia Mother   . Hypertension Sister   . Hyperlipidemia Sister   . Leukemia Sister   . Hyperlipidemia Sister   . Hypertension Sister   . Heart  disease Brother   . Hyperlipidemia Brother   . Hypertension Brother   . COPD Brother   . COPD Brother   . Cancer Sister        BOWEL  . Hyperlipidemia Sister   . Hypertension Sister   . Hyperlipidemia Sister   . Hypertension Sister   . Ovarian cancer Unknown   .  Breast cancer Unknown        Objective:    BP 126/72   Pulse 85   Temp 98.1 F (36.7 C) (Oral)   Resp 16   Ht 5' 6.14" (1.68 m)   Wt 157 lb 3.2 oz (71.3 kg)   SpO2 95%   BMI 25.26 kg/m  Physical Exam  Constitutional: She is oriented to person, place, and time. She appears well-developed and well-nourished. No distress.  HENT:  Head: Normocephalic and atraumatic.  Right Ear: Hearing, tympanic membrane, external ear and ear canal normal.  Left Ear: Hearing, tympanic membrane, external ear and ear canal normal.  Nose: Nose normal.  Mouth/Throat: Oropharynx is clear and moist and mucous membranes are normal. No oral lesions. No dental abscesses, uvula swelling or dental caries. No oropharyngeal exudate.  Upper gums with swelling and erythema anteriorly without fluctuance.  Eyes: Pupils are equal, round, and reactive to light. Conjunctivae and EOM are normal.  Neck: Normal range of motion and full passive range of motion without pain. Neck supple. No JVD present. Carotid bruit is not present. No thyromegaly present.  Cardiovascular: Normal rate, regular rhythm and normal heart sounds. Exam reveals no gallop and no friction rub.  No murmur heard. Pulmonary/Chest: Effort normal and breath sounds normal. She has no wheezes. She has no rales.  Abdominal: Soft. Bowel sounds are normal. She exhibits no distension and no mass. There is no tenderness. There is no rebound and no guarding.  Musculoskeletal:       Right shoulder: Normal.       Left shoulder: Normal.       Cervical back: Normal.  Lymphadenopathy:    She has no cervical adenopathy.  Neurological: She is alert and oriented to person, place, and time. She has  normal reflexes. No cranial nerve deficit. She exhibits normal muscle tone. Coordination normal.  Skin: Skin is warm and dry. No rash noted. She is not diaphoretic. No erythema. No pallor.  Psychiatric: She has a normal mood and affect. Her behavior is normal. Judgment and thought content normal.  Nursing note and vitals reviewed.  No results found. Depression screen [place] [facility] 2/9 05/23/2018 11/22/2017 05/11/2017 11/04/2016 03/03/2016  Decreased Interest 0 0 0 0 0  Down, Depressed, Hopeless 0 0 0 0 0  PHQ - 2 Score 0 0 0 0 0  Altered sleeping - - - - -  Tired, decreased energy - - - - -  Change in appetite - - - - -  Feeling bad or failure about yourself  - - - - -  Trouble concentrating - - - - -  Moving slowly or fidgety/restless - - - - -  PHQ-9 Score - - - - -   Fall Risk  05/23/2018 11/22/2017 05/11/2017 11/04/2016 03/03/2016  Falls in the past year? No No No No No  Number falls in past yr: - - - - -  Injury with Fall? - - - - -         Assessment & Plan:   1. Essential hypertension, benign   2. Pure hypercholesterolemia   3. Primary osteoarthritis of left hip   4. Microscopic hematuria   5. Gingivitis   6. Family history of coronary artery disease   7. Tobacco abuse   8. Squamous cell carcinoma in situ of skin     Hypertension and hypercholesterolemia: controlled; obtain labs; refills provided.  Osteoarthritis L hip with other arthralgias: stable; refill of Ibuprofen provided.  Microscopic hematuria: New; repeat today;  if persistent, refer to urology.  Gingivitis:  New; rx for Amoxicillin provided; recommend follow-up with dentist.  Tobacco abuse: pre-contemplative.  Atypical chest pain: New; sharp shooting atypical pain on RIGHT side.  Non-cardiac in origin.    Microscopic hematuria: New at last visit; repeat today; if persistent, refer to urology due to tobacco abuse.   Orders Placed This Encounter  Procedures  . Urine Microscopic  . CBC with Differential/Platelet     Standing Status:   Future    Number of Occurrences:   1    Standing Expiration Date:   05/24/2019  . Comprehensive metabolic panel    Standing Status:   Future    Number of Occurrences:   1    Standing Expiration Date:   05/24/2019    Order Specific Question:   Has the patient fasted?    Answer:   No  . Lipid panel    Standing Status:   Future    Number of Occurrences:   1    Standing Expiration Date:   05/24/2019    Order Specific Question:   Has the patient fasted?    Answer:   No  . POCT urinalysis dipstick   Meds ordered this encounter  Medications  . amoxicillin (AMOXIL) 500 MG tablet    Sig: Take 2 tablets (1,000 mg total) by mouth 2 (two) times daily.    Dispense:  40 tablet    Refill:  0  . amLODipine-atorvastatin (CADUET) 5-10 MG tablet    Sig: Take 1 tablet by mouth daily.    Dispense:  90 tablet    Refill:  1    Generic please.  Marland Kitchen ibuprofen (ADVIL,MOTRIN) 800 MG tablet    Sig: Take 1 tablet (800 mg total) by mouth every 8 (eight) hours as needed.    Dispense:  90 tablet    Refill:  1    Please consider 90 day supplies to promote better adherence    Return in about 6 months (around 11/23/2018) for complete physical examiniation [place].   Girtie Wiersma Elayne Guerin, M.D. Primary Care at [facility] [address] [place], [place]  [address] [phone] phone [phone] fax

## 2018-05-24 ENCOUNTER — Encounter: Payer: Self-pay | Admitting: Family Medicine

## 2018-05-24 LAB — URINALYSIS, MICROSCOPIC ONLY
BACTERIA UA: NONE SEEN
Casts: NONE SEEN /lpf

## 2018-05-26 LAB — CBC WITH DIFFERENTIAL/PLATELET
BASOS ABS: 0.1 10*3/uL (ref 0.0–0.2)
Basos: 1 %
EOS (ABSOLUTE): 0.1 10*3/uL (ref 0.0–0.4)
Eos: 1 %
HEMATOCRIT: 44.5 % (ref 34.0–46.6)
Hemoglobin: 15.9 g/dL (ref 11.1–15.9)
IMMATURE GRANULOCYTES: 0 %
Immature Grans (Abs): 0 10*3/uL (ref 0.0–0.1)
LYMPHS ABS: 2.6 10*3/uL (ref 0.7–3.1)
Lymphs: 33 %
MCH: 32.6 pg (ref 26.6–33.0)
MCHC: 35.7 g/dL (ref 31.5–35.7)
MCV: 91 fL (ref 79–97)
MONOS ABS: 0.6 10*3/uL (ref 0.1–0.9)
Monocytes: 7 %
NEUTROS PCT: 58 %
Neutrophils Absolute: 4.6 10*3/uL (ref 1.4–7.0)
PLATELETS: 213 10*3/uL (ref 150–450)
RBC: 4.88 x10E6/uL (ref 3.77–5.28)
RDW: 13.2 % (ref 12.3–15.4)
WBC: 7.9 10*3/uL (ref 3.4–10.8)

## 2018-05-26 LAB — COMPREHENSIVE METABOLIC PANEL
ALBUMIN: 4.6 g/dL (ref 3.5–4.8)
ALK PHOS: 75 IU/L (ref 39–117)
ALT: 23 IU/L (ref 0–32)
AST: 29 IU/L (ref 0–40)
Albumin/Globulin Ratio: 2.2 (ref 1.2–2.2)
BILIRUBIN TOTAL: 0.8 mg/dL (ref 0.0–1.2)
BUN / CREAT RATIO: 12 (ref 12–28)
BUN: 12 mg/dL (ref 8–27)
CHLORIDE: 103 mmol/L (ref 96–106)
CO2: 22 mmol/L (ref 20–29)
CREATININE: 1.03 mg/dL — AB (ref 0.57–1.00)
Calcium: 9.8 mg/dL (ref 8.7–10.3)
GFR calc Af Amer: 64 mL/min/{1.73_m2} (ref >59)
GFR calc non Af Amer: 55 mL/min/{1.73_m2} — ABNORMAL LOW (ref >59)
GLOBULIN, TOTAL: 2.1 g/dL (ref 1.5–4.5)
Glucose: 106 mg/dL — ABNORMAL HIGH (ref 65–99)
POTASSIUM: 4.6 mmol/L (ref 3.5–5.2)
SODIUM: 142 mmol/L (ref 134–144)
Total Protein: 6.7 g/dL (ref 6.0–8.5)

## 2018-05-26 LAB — LIPID PANEL
CHOLESTEROL TOTAL: 174 mg/dL (ref 100–199)
Chol/HDL Ratio: 2.2 ratio (ref 0.0–4.4)
HDL: 79 mg/dL (ref >39)
LDL CALC: 82 mg/dL (ref 0–99)
Triglycerides: 64 mg/dL (ref 0–149)
VLDL CHOLESTEROL CAL: 13 mg/dL (ref 5–40)

## 2018-06-09 NOTE — Progress Notes (Signed)
06/10/2018 9:52 AM   Kelly Merritt December 22, 1945 [id number]  Referring provider: Wardell Honour, MD [address] [place], [place] [address]  Chief Complaint  Patient presents with  . Hematuria    New Patient    HPI: Patient is a 72 -year-old Caucasian female who presents today as a referral from Dr. Wardell Honour for microscopic hematuria.      Patient was found to have microscopic hematuria on 05/23/2018 with 3-10 RBC's/hpf.  Patient does have a prior history of hematuria in the 1980's due to Indomethacin.    She does not have a prior history of recurrent urinary tract infections, nephrolithiasis, trauma to the genitourinary tract or malignancies of the genitourinary tract.   She does not have a family medical history of nephrolithiasis, malignancies of the genitourinary tract or hematuria.  Today, she is having symptoms of frequent urination, urgency, nocturia, incontinence and intermittency.   Patient denies any gross hematuria, dysuria or suprapubic/flank pain.  Patient denies any fevers, chills, nausea or vomiting.  Her UA today is negative.    She is a smoker.   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   She has HTN.  She drinks a lot of Coke.  She takes ibuprofen daily.     PMH: Past Medical History:  Diagnosis Date  . Allergic rhinitis, cause unspecified   . Allergy   . Benign neoplasm of other specified sites of skin    Squamous Cell Carcinoma abdomen; [facility]/derm  . Cancer ([place])    Squamous Cell Carcinoma; followed annually by [facility]/[place]  . Depression   . Essential hypertension, benign   . Gastric ulcer, unspecified as acute or chronic, without mention of hemorrhage, perforation, or obstruction   . Headache(784.0)   . Migraine, unspecified, without mention of intractable migraine without mention of status migrainosus   . Mitral valve disorders(424.0)   . Osteoarthrosis, unspecified whether generalized or localized,  lower leg    Severe OA hip.  . Other abnormal glucose   . Pure hypercholesterolemia   . Substance abuse ([place])    CIGARETTES  . Symptomatic menopausal or female climacteric states   . Tobacco use disorder     Surgical History: Past Surgical History:  Procedure Laterality Date  . BREAST BIOPSY  1970  . CARDIAC CATHETERIZATION  10/2008  . DILATION AND CURETTAGE OF UTERUS  1998   heavy menses  . JOINT REPLACEMENT  12/03/2012   L THR  . resection of squamous cell carcinoma forearm  2013   abdomen 2015  . TONSILLECTOMY AND ADENOIDECTOMY  1950    Home Medications:  Allergies as of 06/10/2018      Reactions   Indomethacin Other (See Comments)   SEVERE STOMACH CRAMPS AND BLOOD IN URINE   Lipitor [atorvastatin] Other (See Comments)   myalgias   Niacin And Related       Medication List        Accurate as of 06/10/18  9:52 AM. Always use your most recent med list.          amLODipine-atorvastatin 5-10 MG tablet Commonly known as:  CADUET Take 1 tablet by mouth daily.   aspirin 81 MG tablet Take 81 mg by mouth daily.   calcium citrate 950 MG tablet Commonly known as:  CALCITRATE - dosed in mg elemental calcium Take 1 tablet (200 mg of elemental calcium total) by mouth daily.   Glucosamine-Chondroitin 500-400 MG Caps Take by mouth.   ibuprofen 800 MG tablet  Commonly known as:  ADVIL,MOTRIN Take 1 tablet (800 mg total) by mouth every 8 (eight) hours as needed.   MULTIVITAMINS PO Take by mouth daily.   Omega 3 1000 MG Caps Take 2 capsules by mouth daily.   vitamin C 250 MG tablet Commonly known as:  ASCORBIC ACID Take 250 mg by mouth daily.       Allergies:  Allergies  Allergen Reactions  . Indomethacin Other (See Comments)    SEVERE STOMACH CRAMPS AND BLOOD IN URINE  . Lipitor [Atorvastatin] Other (See Comments)    myalgias  . Niacin And Related     Family History: Family History  Problem Relation Age of Onset  . Alcohol abuse Father   . Drug abuse  Father   . Depression Father   . Cirrhosis Father   . Gout Father   . CAD Father        PTSD  . Heart disease Father 38       AMI/CABG  . Dementia Mother   . Hyperlipidemia Mother   . Hypertension Sister   . Hyperlipidemia Sister   . Leukemia Sister   . Hyperlipidemia Sister   . Hypertension Sister   . Heart disease Brother   . Hyperlipidemia Brother   . Hypertension Brother   . COPD Brother   . COPD Brother   . Cancer Sister        BOWEL  . Hyperlipidemia Sister   . Hypertension Sister   . Hyperlipidemia Sister   . Hypertension Sister   . Ovarian cancer Unknown   . Breast cancer Unknown     Social History:  reports that she has been smoking cigarettes. She started smoking about 51 years ago. She has smoked for the past 30.00 years. She has never used smokeless tobacco. She reports that she does not drink alcohol or use drugs.  ROS: UROLOGY Frequent Urination?: Yes Hard to postpone urination?: Yes Burning/pain with urination?: No Get up at night to urinate?: Yes Leakage of urine?: Yes Urine stream starts and stops?: Yes Trouble starting stream?: No Do you have to strain to urinate?: No Blood in urine?: Yes Urinary tract infection?: No Sexually transmitted disease?: No Injury to kidneys or bladder?: No Painful intercourse?: No Weak stream?: No Currently pregnant?: No Vaginal bleeding?: No Last menstrual period?: n  Gastrointestinal Nausea?: No Vomiting?: No Indigestion/heartburn?: No Diarrhea?: No Constipation?: No  Constitutional Fever: No Night sweats?: No Weight loss?: No Fatigue?: No  Skin Skin rash/lesions?: No Itching?: No  Eyes Blurred vision?: Yes Double vision?: No  Ears/Nose/Throat Sore throat?: No Sinus problems?: No  Hematologic/Lymphatic Swollen glands?: No Easy bruising?: Yes  Cardiovascular Leg swelling?: Yes Chest pain?: Yes  Respiratory Cough?: Yes Shortness of breath?: Yes  Endocrine Excessive thirst?:  No  Musculoskeletal Back pain?: Yes Joint pain?: Yes  Neurological Headaches?: No Dizziness?: Yes  Psychologic Depression?: No Anxiety?: No  Physical Exam: BP (!) 144/87   Pulse 65   Ht 5\' 6"  (1.676 m)   Wt 155 lb (70.3 kg)   BMI 25.02 kg/m   Constitutional:  Well nourished. Alert and oriented, No acute distress. HEENT: [place] AT, moist mucus membranes.  Trachea midline, no masses. Cardiovascular: No clubbing, cyanosis, or edema. Respiratory: Normal respiratory effort, no increased work of breathing. Skin: No rashes, bruises or suspicious lesions. Lymph: No cervical or inguinal adenopathy. Neurologic: Grossly intact, no focal deficits, moving all 4 extremities. Psychiatric: Normal mood and affect.  Laboratory Data: Lab Results  Component Value Date  WBC 7.9 05/26/2018   HGB 15.9 05/26/2018   HCT 44.5 05/26/2018   MCV 91 05/26/2018   PLT 213 05/26/2018    Lab Results  Component Value Date   CREATININE 0.98 06/10/2018    No results found for: PSA  No results found for: TESTOSTERONE  Lab Results  Component Value Date   HGBA1C 5.6 05/11/2017    No results found for: TSH     Component Value Date/Time   CHOL 174 05/26/2018 1006   HDL 79 05/26/2018 1006   CHOLHDL 2.2 05/26/2018 1006   CHOLHDL 2.1 03/03/2016 0843   VLDL 13 03/03/2016 0843   LDLCALC 82 05/26/2018 1006    Lab Results  Component Value Date   AST 29 05/26/2018   Lab Results  Component Value Date   ALT 23 05/26/2018   No components found for: ALKALINEPHOPHATASE No components found for: BILIRUBINTOTAL  No results found for: ESTRADIOL   Urinalysis See HPI and Epic.  I have reviewed the labs.  Assessment & Plan:    1. Microscopic hematuria Explained to the patient that there are a number of causes that can be associated with blood in the urine, such as stones, UTI's, damage to the urinary tract and/or cancer. At this time, I felt that the patient warranted further urologic  evaluation.   The AUA guidelines state that a CT urogram is the preferred imaging study to evaluate hematuria. I explained to the patient that a contrast material will be injected into a vein and that in rare instances, an allergic reaction can result and may even life threatening   The patient denies any allergies to contrast, iodine and/or seafood and is not taking metformin. Following the imaging study,  I've recommended a cystoscopy. I described how this is performed, typically in an office setting with a flexible cystoscope. We described the risks, benefits, and possible side effects, the most common of which is a minor amount of blood in the urine and/or burning which usually resolves in 24 to 48 hours.   The patient had the opportunity to ask questions which were answered. Based upon this discussion, the patient is willing to proceed. Therefore, I've ordered: a CT Urogram and cystoscopy.  The patient will return following all of the above for discussion of the results.  UA  Urine culture  BUN + creatinine      Return for CT Urogram report and cystoscopy.  These notes generated with voice recognition software. I apologize for typographical errors.  Zara Council, PA-C  [facility] [address] [place]  [place],  [address] [phone]

## 2018-06-10 ENCOUNTER — Ambulatory Visit (INDEPENDENT_AMBULATORY_CARE_PROVIDER_SITE_OTHER): Payer: Medicare Other | Admitting: Urology

## 2018-06-10 ENCOUNTER — Encounter: Payer: Self-pay | Admitting: Urology

## 2018-06-10 ENCOUNTER — Other Ambulatory Visit
Admission: RE | Admit: 2018-06-10 | Discharge: 2018-06-10 | Disposition: A | Discharge status: Home / Self Care | Payer: Medicare Other | Source: Ambulatory Visit | Attending: Urology | Admitting: Urology

## 2018-06-10 ENCOUNTER — Other Ambulatory Visit: Payer: Self-pay

## 2018-06-10 VITALS — BP 144/87 | HR 65 | Ht 66.0 in | Wt 155.0 lb

## 2018-06-10 DIAGNOSIS — R3129 Other microscopic hematuria: Secondary | ICD-10-CM

## 2018-06-10 LAB — URINALYSIS, COMPLETE (UACMP) WITH MICROSCOPIC
BACTERIA UA: NONE SEEN
Bilirubin Urine: NEGATIVE
Glucose, UA: NEGATIVE mg/dL
Hgb urine dipstick: NEGATIVE
KETONES UR: NEGATIVE mg/dL
LEUKOCYTES UA: NEGATIVE
Nitrite: NEGATIVE
PH: 5 (ref 5.0–8.0)
PROTEIN: NEGATIVE mg/dL
RBC / HPF: NONE SEEN RBC/hpf (ref 0–5)
Specific Gravity, Urine: 1.02 (ref 1.005–1.030)
WBC, UA: NONE SEEN WBC/hpf (ref 0–5)

## 2018-06-10 LAB — CREATININE, SERUM
CREATININE: 0.98 mg/dL (ref 0.44–1.00)
GFR calc Af Amer: >60 mL/min (ref >60)
GFR calc non Af Amer: 57 mL/min — ABNORMAL LOW (ref >60)

## 2018-06-10 LAB — BUN: BUN: 17 mg/dL (ref 8–23)

## 2018-06-10 NOTE — Patient Instructions (Signed)
Hematuria, Adult Hematuria is blood in your urine. It can be caused by a bladder infection, kidney infection, prostate infection, kidney stone, or cancer of your urinary tract. Infections can usually be treated with medicine, and a kidney stone usually will pass through your urine. If neither of these is the cause of your hematuria, further workup to find out the reason may be needed. It is very important that you tell your health care provider about any blood you see in your urine, even if the blood stops without treatment or happens without causing pain. Blood in your urine that happens and then stops and then happens again can be a symptom of a very serious condition. Also, pain is not a symptom in the initial stages of many urinary cancers. Follow these instructions at home:  Drink lots of fluid, 3-4 quarts a day. If you have been diagnosed with an infection, cranberry juice is especially recommended, in addition to large amounts of water.  Avoid caffeine, tea, and carbonated beverages because they tend to irritate the bladder.  Avoid alcohol because it may irritate the prostate.  Take all medicines as directed by your health care provider.  If you were prescribed an antibiotic medicine, finish it all even if you start to feel better.  If you have been diagnosed with a kidney stone, follow your health care provider's instructions regarding straining your urine to catch the stone.  Empty your bladder often. Avoid holding urine for long periods of time.  After a bowel movement, women should cleanse front to back. Use each tissue only once.  Empty your bladder before and after sexual intercourse if you are a female. Contact a health care provider if:  You develop back pain.  You have a fever.  You have a feeling of sickness in your stomach (nausea) or vomiting.  Your symptoms are not better in 3 days. Return sooner if you are getting worse. Get help right away if:  You develop  severe vomiting and are unable to keep the medicine down.  You develop severe back or abdominal pain despite taking your medicines.  You begin passing a large amount of blood or clots in your urine.  You feel extremely weak or faint, or you pass out. This information is not intended to replace advice given to you by your health care provider. Make sure you discuss any questions you have with your health care provider. Document Released: 10/19/2005 Document Revised: 03/26/2016 Document Reviewed: 06/19/2013 Elsevier Interactive Patient Education  2017 [employer]  CT Scan A CT scan (computed tomography scan) is an imaging scan. It uses X-rays and a computer to make detailed pictures of different areas inside the body. A CT scan can give more information than a regular X-ray exam. A CT scan provides data about internal organs, soft tissue structures, blood vessels, and bones. In this procedure, the pictures will be taken in a large machine that has an opening (CT scanner). Tell a health care provider about:  Any allergies you have.  All medicines you are taking, including vitamins, herbs, eye drops, creams, and over-the-counter medicines.  Any blood disorders you have.  Any surgeries you have had.  Any medical conditions you have.  Whether you are pregnant or may be pregnant. What are the risks? Generally, this is a safe procedure. However, problems may occur, including:  An allergic reaction to dyes.  Development of cancer from excessive exposure to radiation from multiple CT scans. This is rare.  What happens before   the procedure? Staying hydrated Follow instructions from your health care provider about hydration, which may include:  Up to 2 hours before the procedure - you may continue to drink clear liquids, such as water, clear fruit juice, black coffee, and plain tea.  Eating and drinking restrictions Follow instructions from your health care provider about eating and  drinking, which may include:  24 hours before the procedure - stop drinking caffeinated beverages, such as energy drinks, tea, soda, coffee, and hot chocolate.  8 hours before the procedure - stop eating heavy meals or foods such as meat, fried foods, or fatty foods.  6 hours before the procedure - stop eating light meals or foods, such as toast or cereal.  6 hours before the procedure - stop drinking milk or drinks that contain milk.  2 hours before the procedure - stop drinking clear liquids.  General instructions  Remove any jewelry.  Ask your health care provider about changing or stopping your regular medicines. This is especially important if you are taking diabetes medicines or blood thinners. What happens during the procedure?  You will lie on a table with your arms above your head.  An IV tube may be inserted into one of your veins.  The contrast dye may be injected into the IV tube. You may feel warm or have a metallic taste in your mouth.  The table you will be lying on will move into the CT scanner.  You will be able to see, hear, and talk to the person running the machine while you are in it. Follow that person's instructions.  The CT scanner will move around you to take pictures. Do not move while it is scanning. Staying still helps the scanner to get a good image.  When the best possible pictures have been taken, the machine will be turned off. The table will be moved out of the machine.  The IV tube will be removed. The procedure may vary among health care providers and hospitals. What happens after the procedure?  It is up to you to get the results of your procedure. Ask your health care provider, or the department that is doing the procedure, when your results will be ready. Summary  A CT scan is an imaging scan.  A CT scan uses X-rays and a computer to make detailed pictures of different areas of your body.  Follow instructions from your health care  provider about eating and drinking before the procedure.  You will be able to see, hear, and talk to the person running the machine while you are in it. Follow that person's instructions. This information is not intended to replace advice given to you by your health care provider. Make sure you discuss any questions you have with your health care provider. Document Released: 11/26/2004 Document Revised: 11/21/2016 Document Reviewed: 11/21/2016 Elsevier Interactive Patient Education  2018 [employer]  Cystoscopy Cystoscopy is a procedure that is used to help diagnose and sometimes treat conditions that affect that lower urinary tract. The lower urinary tract includes the bladder and the tube that drains urine from the bladder out of the body (urethra). Cystoscopy is performed with a thin, tube-shaped instrument with a light and camera at the end (cystoscope). The cystoscope may be hard (rigid) or flexible, depending on the goal of the procedure.The cystoscope is inserted through the urethra, into the bladder. Cystoscopy may be recommended if you have:  Urinary tractinfections that keep coming back (recurring).  Blood in the urine (hematuria).    Loss of bladder control (urinary incontinence) or an overactive bladder.  Unusual cells found in a urine sample.  A blockage in the urethra.  Painful urination.  An abnormality in the bladder found during an intravenous pyelogram (IVP) or CT scan.  Cystoscopy may also be done to remove a sample of tissue to be examined under a microscope (biopsy). Tell a health care provider about:  Any allergies you have.  All medicines you are taking, including vitamins, herbs, eye drops, creams, and over-the-counter medicines.  Any problems you or family members have had with anesthetic medicines.  Any blood disorders you have.  Any surgeries you have had.  Any medical conditions you have.  Whether you are pregnant or may be pregnant. What are the  risks? Generally, this is a safe procedure. However, problems may occur, including:  Infection.  Bleeding.  Allergic reactions to medicines.  Damage to other structures or organs.  What happens before the procedure?  Ask your health care provider about: ? Changing or stopping your regular medicines. This is especially important if you are taking diabetes medicines or blood thinners. ? Taking medicines such as aspirin and ibuprofen. These medicines can thin your blood. Do not take these medicines before your procedure if your health care provider instructs you not to.  Follow instructions from your health care provider about eating or drinking restrictions.  You may be given antibiotic medicine to help prevent infection.  You may have an exam or testing, such as X-rays of the bladder, urethra, or kidneys.  You may have urine tests to check for signs of infection.  Plan to have someone take you home after the procedure. What happens during the procedure?  To reduce your risk of infection,your health care team will wash or sanitize their hands.  You will be given one or more of the following: ? A medicine to help you relax (sedative). ? A medicine to numb the area (local anesthetic).  The area around the opening of your urethra will be cleaned.  The cystoscope will be passed through your urethra into your bladder.  Germ-free (sterile)fluid will flow through the cystoscope to fill your bladder. The fluid will stretch your bladder so that your surgeon can clearly examine your bladder walls.  The cystoscope will be removed and your bladder will be emptied. The procedure may vary among health care providers and hospitals. What happens after the procedure?  You may have some soreness or pain in your abdomen and urethra. Medicines will be available to help you.  You may have some blood in your urine.  Do not drive for 24 hours if you received a sedative. This information is  not intended to replace advice given to you by your health care provider. Make sure you discuss any questions you have with your health care provider. Document Released: 10/16/2000 Document Revised: 02/27/2016 Document Reviewed: 09/05/2015 Elsevier Interactive Patient Education  2018 [employer] Inc.  

## 2018-06-12 LAB — URINE CULTURE

## 2018-06-21 ENCOUNTER — Ambulatory Visit
Admission: RE | Admit: 2018-06-21 | Discharge: 2018-06-21 | Disposition: A | Discharge status: Home / Self Care | Payer: Medicare Other | Source: Ambulatory Visit | Attending: Urology | Admitting: Urology

## 2018-06-21 DIAGNOSIS — R3129 Other microscopic hematuria: Secondary | ICD-10-CM | POA: Diagnosis not present

## 2018-06-21 DIAGNOSIS — N852 Hypertrophy of uterus: Secondary | ICD-10-CM | POA: Diagnosis not present

## 2018-06-21 DIAGNOSIS — I700 Atherosclerosis of aorta: Secondary | ICD-10-CM | POA: Insufficient documentation

## 2018-06-21 MED ORDER — IOPAMIDOL (ISOVUE-300) INJECTION 61%
150.0000 mL | Freq: Once | INTRAVENOUS | Status: AC | PRN
  Administered 2018-06-21: 125 mL via INTRAVENOUS

## 2018-07-26 ENCOUNTER — Encounter: Payer: Self-pay | Admitting: Urology

## 2018-07-26 ENCOUNTER — Ambulatory Visit (INDEPENDENT_AMBULATORY_CARE_PROVIDER_SITE_OTHER): Payer: Medicare Other | Admitting: Urology

## 2018-07-26 VITALS — BP 133/83 | HR 70 | Ht 66.0 in | Wt 153.0 lb

## 2018-07-26 DIAGNOSIS — D494 Neoplasm of unspecified behavior of bladder: Secondary | ICD-10-CM

## 2018-07-26 DIAGNOSIS — F17200 Nicotine dependence, unspecified, uncomplicated: Secondary | ICD-10-CM

## 2018-07-26 DIAGNOSIS — R3129 Other microscopic hematuria: Secondary | ICD-10-CM | POA: Diagnosis not present

## 2018-07-26 LAB — URINALYSIS, COMPLETE
BILIRUBIN UA: NEGATIVE
GLUCOSE, UA: NEGATIVE
Ketones, UA: NEGATIVE
Leukocytes, UA: NEGATIVE
NITRITE UA: NEGATIVE
PH UA: 5.5 (ref 5.0–7.5)
Protein, UA: NEGATIVE
Specific Gravity, UA: 1.015 (ref 1.005–1.030)
UUROB: 0.2 mg/dL (ref 0.2–1.0)

## 2018-07-26 LAB — MICROSCOPIC EXAMINATION
RBC MICROSCOPIC, UA: NONE SEEN /HPF (ref 0–2)
WBC UA: NONE SEEN /HPF (ref 0–5)

## 2018-07-26 NOTE — H&P (View-Only) (Signed)
   07/26/18  CC:  Chief Complaint  Patient presents with  . Cysto    HPI: 72 year old smoker with microscopic hematuria who presents today for office cystoscopy.  She underwent CT urogram on 06/21/2018 which was unremarkable with normal kidneys without upper tract filling defects.  Blood pressure 133/83, pulse 70, height 5\' 6"  (1.676 m), weight 153 lb (69.4 kg). NED. A&Ox3.   No respiratory distress   Abd soft, NT, ND Normal external genitalia with patent urethral meatus  Cystoscopy Procedure Note  Patient identification was confirmed, informed consent was obtained, and patient was prepped using Betadine solution.  Lidocaine jelly was administered per urethral meatus.    Procedure: - Flexible cystoscope introduced, without any difficulty.   - Thorough search of the bladder revealed:    normal urethral meatus    normal urothelium with papillary tumor located at the left hemitrigone extending to the left lateral wall, approximately 1.5 cm in length with somewhat low-grade appearance.    no stones    no ulcers     no tumors    no urethral polyps    no trabeculation  -Right ureteral orifice normal with clear reflux of urine.  Left UO is somewhat difficult and difficult to visualize due to presence of tumor.  Post-Procedure: - Patient tolerated the procedure well  Assessment/ Plan:  1. Microscopic hematuria As below - Urinalysis, Complete -Urine culture  2. 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  Risk and benefits of surgery were discussed in detail including risk of bleeding, infection, damage surrounding structures, stent irritation, need for further procedures, bladder perforation amongst others.  All questions were answered in detail.   Preop UA/urine culture today.  3. Smoker Current smoker Strongly encouraged cessation today, positive relationship between bladder cancer to ongoing smoking was discussed   Hollice Espy, MD

## 2018-07-26 NOTE — Progress Notes (Signed)
   07/26/18  CC:  Chief Complaint  Patient presents with  . Cysto    HPI: 72 year old smoker with microscopic hematuria who presents today for office cystoscopy.  She underwent CT urogram on 06/21/2018 which was unremarkable with normal kidneys without upper tract filling defects.  Blood pressure 133/83, pulse 70, height 5\' 6"  (1.676 m), weight 153 lb (69.4 kg). NED. A&Ox3.   No respiratory distress   Abd soft, NT, ND Normal external genitalia with patent urethral meatus  Cystoscopy Procedure Note  Patient identification was confirmed, informed consent was obtained, and patient was prepped using Betadine solution.  Lidocaine jelly was administered per urethral meatus.    Procedure: - Flexible cystoscope introduced, without any difficulty.   - Thorough search of the bladder revealed:    normal urethral meatus    normal urothelium with papillary tumor located at the left hemitrigone extending to the left lateral wall, approximately 1.5 cm in length with somewhat low-grade appearance.    no stones    no ulcers     no tumors    no urethral polyps    no trabeculation  -Right ureteral orifice normal with clear reflux of urine.  Left UO is somewhat difficult and difficult to visualize due to presence of tumor.  Post-Procedure: - Patient tolerated the procedure well  Assessment/ Plan:  1. Microscopic hematuria As below - Urinalysis, Complete -Urine culture  2. 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  Risk and benefits of surgery were discussed in detail including risk of bleeding, infection, damage surrounding structures, stent irritation, need for further procedures, bladder perforation amongst others.  All questions were answered in detail.   Preop UA/urine culture today.  3. Smoker Current smoker Strongly encouraged cessation today, positive relationship between bladder cancer to ongoing smoking was discussed   Hollice Espy, MD

## 2018-07-28 ENCOUNTER — Telehealth: Payer: Self-pay

## 2018-07-28 NOTE — Telephone Encounter (Signed)
Patient notified of surgery on 08-03-18 and pre-op tomorrow, previously notified

## 2018-07-29 ENCOUNTER — Other Ambulatory Visit: Payer: Self-pay

## 2018-07-29 ENCOUNTER — Encounter
Admission: RE | Admit: 2018-07-29 | Discharge: 2018-07-29 | Disposition: A | Discharge status: Home / Self Care | Payer: Medicare Other | Source: Ambulatory Visit | Attending: Urology | Admitting: Urology

## 2018-07-29 DIAGNOSIS — R001 Bradycardia, unspecified: Secondary | ICD-10-CM | POA: Diagnosis not present

## 2018-07-29 DIAGNOSIS — I10 Essential (primary) hypertension: Secondary | ICD-10-CM | POA: Diagnosis not present

## 2018-07-29 DIAGNOSIS — Z01810 Encounter for preprocedural cardiovascular examination: Secondary | ICD-10-CM | POA: Diagnosis not present

## 2018-07-29 HISTORY — DX: Pneumonia, unspecified organism: J18.9

## 2018-07-29 HISTORY — DX: Cardiac murmur, unspecified: R01.1

## 2018-07-29 LAB — CULTURE, URINE COMPREHENSIVE

## 2018-07-29 NOTE — Patient Instructions (Signed)
  Your procedure is scheduled on: Wednesday August 03, 2018 Report to Same Day Surgery 2nd floor medical mall [facility] Entrance-take elevator on left to 2nd floor.  Check in with surgery information desk.) To find out your arrival time please call [phone] between 1PM - 3PM on Tuesday August 02, 2018  Remember: Instructions that are not followed completely may result in serious medical risk, up to and including death, or upon the discretion of your surgeon and anesthesiologist your surgery may need to be rescheduled.    _x___ 1. Do not eat food (including mints, candies, chewing gum) after midnight the night before your procedure. You may drink clear liquids up to 2 hours before you are scheduled to arrive at the hospital for your procedure.  Do not drink clear liquids within 2 hours of your scheduled arrival to the hospital.  Clear liquids include  --Water or Apple juice without pulp  --Clear carbohydrate beverage such as Gatorade  --Black Coffee or Clear Tea (No milk, no creamers, do not add anything to the coffee or tea)    __x__ 2. No Alcohol for 24 hours before or after surgery.   __x__ 3. No Smoking or e-cigarettes for 24 prior to surgery.  Do not use any chewable tobacco products for at least 6 hour prior to surgery   __x__ 4. Notify your doctor if there is any change in your medical condition (cold, fever, infections).   __x__ 5. On the morning of surgery brush your teeth with toothpaste and water.  You may rinse your mouth with mouth wash if you wish.  Do not swallow any toothpaste or mouthwash.   Do not wear jewelry, make-up, hairpins, clips or nail polish.  Do not wear lotions, powders, deodorant, or perfumes.   Do not shave below the face/neck 48 hours prior to surgery.   Do not bring valuables to the hospital.    [facility] is not responsible for any belongings or valuables.               Contacts, dentures or bridgework may not be worn into surgery.  For  patients discharged on the day of surgery, you will NOT be permitted to drive yourself home.   _x___ Take anti-hypertensive listed below, cardiac, seizure, asthma, anti-reflux and psychiatric medicines. These include:  1. Amlodipine-Atorvastatin (Caduet)  _x___ Follow recommendations from Cardiologist, Pulmonologist or PCP regarding stopping Aspirin, Coumadin, Plavix ,Eliquis, Effient, or Pradaxa, and Pletal.  _x___ NOW: Stop Anti-inflammatories such as Advil, Aleve, Ibuprofen, Motrin, Naproxen, Naprosyn, Goodies powders or aspirin products. OK to take Tylenol and Celebrex.   _x___ NOW: Stop supplements until after surgery.  But may continue Vitamin D, Vitamin B, and multivitamin.

## 2018-08-02 MED ORDER — CEFAZOLIN SODIUM-DEXTROSE 2-4 GM/100ML-% IV SOLN
2.0000 g | Freq: Once | INTRAVENOUS | Status: AC
  Administered 2018-08-03: 2 g via INTRAVENOUS

## 2018-08-03 ENCOUNTER — Ambulatory Visit: Payer: Medicare Other | Admitting: Registered Nurse

## 2018-08-03 ENCOUNTER — Ambulatory Visit
Admission: RE | Admit: 2018-08-03 | Discharge: 2018-08-03 | Disposition: A | Discharge status: Home / Self Care | Payer: Medicare Other | Source: Ambulatory Visit | Attending: Urology | Admitting: Urology

## 2018-08-03 ENCOUNTER — Encounter
Admission: RE | Disposition: A | Discharge status: Home / Self Care | Payer: Self-pay | Source: Ambulatory Visit | Attending: Urology

## 2018-08-03 ENCOUNTER — Other Ambulatory Visit: Payer: Self-pay

## 2018-08-03 ENCOUNTER — Encounter: Payer: Self-pay | Admitting: *Deleted

## 2018-08-03 DIAGNOSIS — D494 Neoplasm of unspecified behavior of bladder: Secondary | ICD-10-CM | POA: Diagnosis not present

## 2018-08-03 DIAGNOSIS — N3031 Trigonitis with hematuria: Secondary | ICD-10-CM | POA: Insufficient documentation

## 2018-08-03 DIAGNOSIS — I10 Essential (primary) hypertension: Secondary | ICD-10-CM | POA: Insufficient documentation

## 2018-08-03 DIAGNOSIS — F17210 Nicotine dependence, cigarettes, uncomplicated: Secondary | ICD-10-CM | POA: Insufficient documentation

## 2018-08-03 DIAGNOSIS — Z79899 Other long term (current) drug therapy: Secondary | ICD-10-CM | POA: Diagnosis not present

## 2018-08-03 DIAGNOSIS — C672 Malignant neoplasm of lateral wall of bladder: Secondary | ICD-10-CM | POA: Diagnosis not present

## 2018-08-03 DIAGNOSIS — Z85828 Personal history of other malignant neoplasm of skin: Secondary | ICD-10-CM | POA: Insufficient documentation

## 2018-08-03 DIAGNOSIS — N3081 Other cystitis with hematuria: Secondary | ICD-10-CM | POA: Diagnosis not present

## 2018-08-03 HISTORY — PX: TRANSURETHRAL RESECTION OF BLADDER TUMOR: SHX2575

## 2018-08-03 HISTORY — PX: CYSTOSCOPY W/ RETROGRADES: SHX1426

## 2018-08-03 SURGERY — TURBT (TRANSURETHRAL RESECTION OF BLADDER TUMOR)
Anesthesia: General | Site: Ureter

## 2018-08-03 MED ORDER — ONDANSETRON HCL 4 MG/2ML IJ SOLN
INTRAMUSCULAR | Status: DC | PRN
  Administered 2018-08-03: 4 mg via INTRAVENOUS

## 2018-08-03 MED ORDER — DEXAMETHASONE SODIUM PHOSPHATE 10 MG/ML IJ SOLN
INTRAMUSCULAR | Status: AC
  Filled 2018-08-03: qty 1

## 2018-08-03 MED ORDER — LACTATED RINGERS IV SOLN
INTRAVENOUS | Status: DC
  Administered 2018-08-03: 10:00:00 via INTRAVENOUS

## 2018-08-03 MED ORDER — OXYCODONE HCL 5 MG PO TABS: 5.0000 mg | ORAL_TABLET | Freq: Once | ORAL | Status: DC | PRN

## 2018-08-03 MED ORDER — MIDAZOLAM HCL 2 MG/2ML IJ SOLN
INTRAMUSCULAR | Status: DC | PRN
  Administered 2018-08-03: 2 mg via INTRAVENOUS

## 2018-08-03 MED ORDER — MITOMYCIN CHEMO FOR BLADDER INSTILLATION 40 MG
INTRAVENOUS | Status: DC | PRN
  Administered 2018-08-03: 40 mg via INTRAVESICAL

## 2018-08-03 MED ORDER — IOTHALAMATE MEGLUMINE 43 % IV SOLN
INTRAVENOUS | Status: DC | PRN
  Administered 2018-08-03: 15 mL via URETHRAL

## 2018-08-03 MED ORDER — HYDROCODONE-ACETAMINOPHEN 5-325 MG PO TABS: 1.0000 | ORAL_TABLET | Freq: Four times a day (QID) | ORAL | 0 refills | Status: DC | PRN

## 2018-08-03 MED ORDER — PROPOFOL 10 MG/ML IV BOLUS
INTRAVENOUS | Status: AC
  Filled 2018-08-03: qty 20

## 2018-08-03 MED ORDER — EPHEDRINE SULFATE 50 MG/ML IJ SOLN
INTRAMUSCULAR | Status: DC | PRN
  Administered 2018-08-03: 10 mg via INTRAVENOUS

## 2018-08-03 MED ORDER — PROMETHAZINE HCL 25 MG/ML IJ SOLN: 6.2500 mg | INTRAMUSCULAR | Status: DC | PRN

## 2018-08-03 MED ORDER — ONDANSETRON HCL 4 MG/2ML IJ SOLN
INTRAMUSCULAR | Status: AC
  Filled 2018-08-03: qty 2

## 2018-08-03 MED ORDER — TAMSULOSIN HCL 0.4 MG PO CAPS: 0.4000 mg | ORAL_CAPSULE | Freq: Every day | ORAL | 0 refills | Status: DC

## 2018-08-03 MED ORDER — LIDOCAINE HCL (PF) 2 % IJ SOLN
INTRAMUSCULAR | Status: AC
  Filled 2018-08-03: qty 10

## 2018-08-03 MED ORDER — FENTANYL CITRATE (PF) 100 MCG/2ML IJ SOLN
25.0000 ug | INTRAMUSCULAR | Status: DC | PRN
  Administered 2018-08-03 (×4): 25 ug via INTRAVENOUS

## 2018-08-03 MED ORDER — GLYCOPYRROLATE 0.2 MG/ML IJ SOLN
INTRAMUSCULAR | Status: DC | PRN
  Administered 2018-08-03: 0.2 mg via INTRAVENOUS

## 2018-08-03 MED ORDER — FENTANYL CITRATE (PF) 100 MCG/2ML IJ SOLN
INTRAMUSCULAR | Status: AC
  Filled 2018-08-03: qty 2

## 2018-08-03 MED ORDER — MEPERIDINE HCL 50 MG/ML IJ SOLN: 6.2500 mg | INTRAMUSCULAR | Status: DC | PRN

## 2018-08-03 MED ORDER — CEFAZOLIN SODIUM-DEXTROSE 2-4 GM/100ML-% IV SOLN
INTRAVENOUS | Status: AC
  Filled 2018-08-03: qty 100

## 2018-08-03 MED ORDER — FENTANYL CITRATE (PF) 100 MCG/2ML IJ SOLN
INTRAMUSCULAR | Status: DC | PRN
  Administered 2018-08-03: 50 ug via INTRAVENOUS

## 2018-08-03 MED ORDER — MIDAZOLAM HCL 2 MG/2ML IJ SOLN
INTRAMUSCULAR | Status: AC
  Filled 2018-08-03: qty 2

## 2018-08-03 MED ORDER — OXYBUTYNIN CHLORIDE 5 MG PO TABS: 5.0000 mg | ORAL_TABLET | Freq: Three times a day (TID) | ORAL | 0 refills | Status: DC | PRN

## 2018-08-03 MED ORDER — GLYCOPYRROLATE 0.2 MG/ML IJ SOLN
INTRAMUSCULAR | Status: AC
  Filled 2018-08-03: qty 1

## 2018-08-03 MED ORDER — OXYCODONE HCL 5 MG/5ML PO SOLN: 5.0000 mg | Freq: Once | ORAL | Status: DC | PRN

## 2018-08-03 MED ORDER — PROPOFOL 10 MG/ML IV BOLUS
INTRAVENOUS | Status: DC | PRN
  Administered 2018-08-03: 160 mg via INTRAVENOUS
  Administered 2018-08-03: 40 mg via INTRAVENOUS

## 2018-08-03 MED ORDER — FAMOTIDINE 20 MG PO TABS
20.0000 mg | ORAL_TABLET | Freq: Once | ORAL | Status: AC
  Administered 2018-08-03: 20 mg via ORAL

## 2018-08-03 MED ORDER — FAMOTIDINE 20 MG PO TABS
ORAL_TABLET | ORAL | Status: AC
  Filled 2018-08-03: qty 1

## 2018-08-03 MED ORDER — DEXAMETHASONE SODIUM PHOSPHATE 10 MG/ML IJ SOLN
INTRAMUSCULAR | Status: DC | PRN
  Administered 2018-08-03: 10 mg via INTRAVENOUS

## 2018-08-03 MED ORDER — MITOMYCIN CHEMO FOR BLADDER INSTILLATION 40 MG: 40.0000 mg | Freq: Once | INTRAVENOUS | Status: DC

## 2018-08-03 MED ORDER — LIDOCAINE HCL (CARDIAC) PF 100 MG/5ML IV SOSY
PREFILLED_SYRINGE | INTRAVENOUS | Status: DC | PRN
  Administered 2018-08-03: 80 mg via INTRAVENOUS

## 2018-08-03 MED ORDER — DOCUSATE SODIUM 100 MG PO CAPS: 100.0000 mg | ORAL_CAPSULE | Freq: Two times a day (BID) | ORAL | 0 refills | Status: DC

## 2018-08-03 MED ORDER — FENTANYL CITRATE (PF) 100 MCG/2ML IJ SOLN
INTRAMUSCULAR | Status: AC
  Administered 2018-08-03: 25 ug via INTRAVENOUS
  Filled 2018-08-03: qty 2

## 2018-08-03 SURGICAL SUPPLY — 37 items
BAG DRAIN CYSTO-URO LG1000N (MISCELLANEOUS) ×5 IMPLANT
BAG URINE DRAINAGE (UROLOGICAL SUPPLIES) ×5 IMPLANT
BRUSH SCRUB EZ  4% CHG (MISCELLANEOUS) ×2
BRUSH SCRUB EZ 4% CHG (MISCELLANEOUS) ×3 IMPLANT
CATH FOLEY 2WAY  5CC 16FR (CATHETERS) ×2
CATH FOLEY 2WAY 5CC 16FR (CATHETERS) ×3
CATH URETL 5X70 OPEN END (CATHETERS) ×5 IMPLANT
CATH URTH 16FR FL 2W BLN LF (CATHETERS) ×3 IMPLANT
CONRAY 43 FOR UROLOGY 50M (MISCELLANEOUS) ×5 IMPLANT
CRADLE LAMINECT ARM (MISCELLANEOUS) ×5 IMPLANT
DRAPE UTILITY 15X26 TOWEL STRL (DRAPES) ×5 IMPLANT
DRSG TELFA 4X3 1S NADH ST (GAUZE/BANDAGES/DRESSINGS) ×8 IMPLANT
ELECT LOOP 22F BIPOLAR SML (ELECTROSURGICAL) ×5
ELECT REM PT RETURN 9FT ADLT (ELECTROSURGICAL)
ELECTRODE LOOP 22F BIPOLAR SML (ELECTROSURGICAL) ×1 IMPLANT
ELECTRODE REM PT RTRN 9FT ADLT (ELECTROSURGICAL) IMPLANT
GLOVE BIO SURGEON STRL SZ 6.5 (GLOVE) ×4 IMPLANT
GLOVE BIO SURGEONS STRL SZ 6.5 (GLOVE) ×1
GOWN STRL REUS W/ TWL LRG LVL3 (GOWN DISPOSABLE) ×8 IMPLANT
GOWN STRL REUS W/TWL LRG LVL3 (GOWN DISPOSABLE) ×15
KIT TURNOVER CYSTO (KITS) ×5 IMPLANT
LOOP CUT BIPOLAR 24F LRG (ELECTROSURGICAL) IMPLANT
NDL SAFETY ECLIPSE 18X1.5 (NEEDLE) ×3 IMPLANT
NEEDLE HYPO 18GX1.5 SHARP (NEEDLE) ×5
PACK CYSTO AR (MISCELLANEOUS) ×5 IMPLANT
SENSORWIRE 0.038 NOT ANGLED (WIRE) ×5
SET CYSTO W/LG BORE CLAMP LF (SET/KITS/TRAYS/PACK) ×2 IMPLANT
SET IRRIG Y TYPE TUR BLADDER L (SET/KITS/TRAYS/PACK) ×5 IMPLANT
SOL .9 NS 3000ML IRR  AL (IV SOLUTION) ×2
SOL .9 NS 3000ML IRR AL (IV SOLUTION) ×3
SOL .9 NS 3000ML IRR UROMATIC (IV SOLUTION) ×3 IMPLANT
STENT URET 6FRX24 CONTOUR (STENTS) IMPLANT
STENT URET 6FRX26 CONTOUR (STENTS) IMPLANT
SURGILUBE 2OZ TUBE FLIPTOP (MISCELLANEOUS) ×5 IMPLANT
SYRINGE IRR TOOMEY STRL 70CC (SYRINGE) ×5 IMPLANT
WATER STERILE IRR 1000ML POUR (IV SOLUTION) ×5 IMPLANT
WIRE SENSOR 0.038 NOT ANGLED (WIRE) ×3 IMPLANT

## 2018-08-03 NOTE — Anesthesia Procedure Notes (Signed)
Procedure Name: LMA Insertion Date/Time: 08/03/2018 11:10 AM Performed by: Doreen Salvage, CRNA Pre-anesthesia Checklist: Patient identified, Patient being monitored, Timeout performed, Emergency Drugs available and Suction available Patient Re-evaluated:Patient Re-evaluated prior to induction Oxygen Delivery Method: Circle system utilized Preoxygenation: Pre-oxygenation with 100% oxygen Induction Type: IV induction Ventilation: Mask ventilation without difficulty LMA: LMA inserted LMA Size: 3.5 Tube type: Oral Number of attempts: 1 Placement Confirmation: positive ETCO2 and breath sounds checked- equal and bilateral Tube secured with: Tape Dental Injury: Teeth and Oropharynx as per pre-operative assessment

## 2018-08-03 NOTE — Anesthesia Post-op Follow-up Note (Signed)
Anesthesia QCDR form completed.        

## 2018-08-03 NOTE — Interval H&P Note (Signed)
History and Physical Interval Note:  08/03/2018 10:35 AM  Kelly Merritt  has presented today for surgery, with the diagnosis of BLADDER CANCER  The various methods of treatment have been discussed with the patient and family. After consideration of risks, benefits and other options for treatment, the patient has consented to  Procedure(s): TRANSURETHRAL RESECTION OF BLADDER TUMOR (TURBT)- MITOMYCIN (N/A) CYSTOSCOPY WITH RETROGRADE PYELOGRAM (Bilateral) CYSTOSCOPY WITH STENT PLACEMENT (Bilateral) as a surgical intervention .  The patient's history has been reviewed, patient examined, no change in status, stable for surgery.  I have reviewed the patient's chart and labs.  Questions were answered to the patient's satisfaction.    RRR CTAB  Hollice Espy

## 2018-08-03 NOTE — Transfer of Care (Signed)
Immediate Anesthesia Transfer of Care Note  Patient: Kelly Merritt  Procedure(s) Performed: Procedure(s): TRANSURETHRAL RESECTION OF BLADDER TUMOR (TURBT)- MITOMYCIN (N/A) CYSTOSCOPY WITH RETROGRADE PYELOGRAM (Left)  Patient Location: PACU  Anesthesia Type:General  Level of Consciousness: sedated  Airway & Oxygen Therapy: Patient Spontanous Breathing and Patient connected to face mask oxygen  Post-op Assessment: Report given to RN and Post -op Vital signs reviewed and stable  Post vital signs: Reviewed and stable  Last Vitals:  Vitals:   08/03/18 0954 08/03/18 1207  BP: 119/90 137/78  Pulse: 63 85  Resp: 18 13  Temp: 36.6 C (!) 36.3 C  SpO2: 94% 854%    Complications: No apparent anesthesia complications

## 2018-08-03 NOTE — Discharge Instructions (Signed)
Transurethral Resection of Bladder Tumor (TURBT) or Bladder Biopsy ° ° °Definition: ° Transurethral Resection of the Bladder Tumor is a surgical procedure used to diagnose and remove tumors within the bladder. TURBT is the most common treatment for early stage bladder cancer. ° °General instructions: °   ° Your recent bladder surgery requires very little post hospital care but some definite precautions. ° °Despite the fact that no skin incisions were used, the area around the bladder incisions are raw and covered with scabs to promote healing and prevent bleeding. Certain precautions are needed to insure that the scabs are not disturbed over the next 2-4 weeks while the healing proceeds. ° °Because the raw surface inside your bladder and the irritating effects of urine you may expect frequency of urination and/or urgency (a stronger desire to urinate) and perhaps even getting up at night more often. This will usually resolve or improve slowly over the healing period. You may see some blood in your urine over the first 6 weeks. Do not be alarmed, even if the urine was clear for a while. Get off your feet and drink lots of fluids until clearing occurs. If you start to pass clots or don't improve call us. ° °Diet: ° °You may return to your normal diet immediately. Because of the raw surface of your bladder, alcohol, spicy foods, foods high in acid and drinks with caffeine may cause irritation or frequency and should be used in moderation. To keep your urine flowing freely and avoid constipation, drink plenty of fluids during the day (8-10 glasses). Tip: Avoid cranberry juice because it is very acidic. ° °Activity: ° °Your physical activity doesn't need to be restricted. However, if you are very active, you may see some blood in the urine. We suggest that you reduce your activity under the circumstances until the bleeding has stopped. ° °Bowels: ° °It is important to keep your bowels regular during the postoperative  period. Straining with bowel movements can cause bleeding. A bowel movement every other day is reasonable. Use a mild laxative if needed, such as milk of magnesia 2-3 tablespoons, or 2 Dulcolax tablets. Call if you continue to have problems. If you had been taking narcotics for pain, before, during or after your surgery, you may be constipated. Take a laxative if necessary. ° ° ° °Medication: ° °You should resume your pre-surgery medications unless told not to. In addition you may be given an antibiotic to prevent or treat infection. Antibiotics are not always necessary. All medication should be taken as prescribed until the bottles are finished unless you are having an unusual reaction to one of the drugs. ° ° °[facility] °[place], [place] [address] °[phone] ° ° ° °AMBULATORY SURGERY  °DISCHARGE INSTRUCTIONS ° ° °1) The drugs that you were given will stay in your system until tomorrow so for the next 24 hours you should not: ° °A) Drive an automobile °B) Make any legal decisions °C) Drink any alcoholic beverage ° ° °2) You may resume regular meals tomorrow.  Today it is better to start with liquids and gradually work up to solid foods. ° °You may eat anything you prefer, but it is better to start with liquids, then soup and crackers, and gradually work up to solid foods. ° ° °3) Please notify your doctor immediately if you have any unusual bleeding, trouble breathing, redness and pain at the surgery site, drainage, fever, or pain not relieved by medication. ° ° ° °4) Additional Instructions: ° ° ° ° ° ° ° °  Please contact your physician with any problems or Same Day Surgery at [phone], Monday through Friday 6 am to 4 pm, or [place] at  Main number at [phone]. ° °

## 2018-08-03 NOTE — Anesthesia Preprocedure Evaluation (Signed)
Anesthesia Evaluation  Patient identified by MRN, date of birth, ID band Patient awake    Reviewed: Allergy & Precautions, NPO status , Patient's Chart, lab work & pertinent test results  History of Anesthesia Complications Negative for: history of anesthetic complications  Airway Mallampati: II  TM Distance: >3 FB Neck ROM: Full    Dental  (+) Poor Dentition, Missing   Pulmonary neg sleep apnea, neg COPD, Current Smoker,    breath sounds clear to auscultation- rhonchi (-) wheezing      Cardiovascular hypertension, Pt. on medications (-) CAD, (-) Past MI, (-) Cardiac Stents and (-) CABG  Rhythm:Regular Rate:Normal - Systolic murmurs and - Diastolic murmurs    Neuro/Psych  Headaches, PSYCHIATRIC DISORDERS Depression    GI/Hepatic Neg liver ROS, PUD,   Endo/Other  negative endocrine ROSneg diabetes  Renal/GU negative Renal ROS     Musculoskeletal  (+) Arthritis ,   Abdominal (+) - obese,   Peds  Hematology negative hematology ROS (+)   Anesthesia Other Findings Past Medical History: No date: Allergic rhinitis, cause unspecified No date: Allergy No date: Benign neoplasm of other specified sites of skin     Comment:  Squamous Cell Carcinoma abdomen; Isenstein/derm No date: Cancer ([place])     Comment:  Squamous Cell Carcinoma; followed annually by               [place] No date: Depression No date: Essential hypertension, benign No date: Gastric ulcer, unspecified as acute or chronic, without  mention of hemorrhage, perforation, or obstruction No date: Headache(784.0) No date: Heart murmur No date: Migraine, unspecified, without mention of intractable  migraine without mention of status migrainosus No date: Mitral valve disorders(424.0) No date: Osteoarthrosis, unspecified whether generalized or localized,  lower leg     Comment:  Severe OA hip. No date: Other abnormal glucose No date: Pneumonia No  date: Pure hypercholesterolemia No date: Substance abuse ([place])     Comment:  CIGARETTES No date: Symptomatic menopausal or female climacteric states No date: Tobacco use disorder   Reproductive/Obstetrics                             Anesthesia Physical Anesthesia Plan  ASA: II  Anesthesia Plan: General   Post-op Pain Management:    Induction: Intravenous  PONV Risk Score and Plan: 1 and Ondansetron and Midazolam  Airway Management Planned: LMA  Additional Equipment:   Intra-op Plan:   Post-operative Plan:   Informed Consent: I have reviewed the patients History and Physical, chart, labs and discussed the procedure including the risks, benefits and alternatives for the proposed anesthesia with the patient or authorized representative who has indicated his/her understanding and acceptance.   Dental advisory given  Plan Discussed with: CRNA and Anesthesiologist  Anesthesia Plan Comments:         Anesthesia Quick Evaluation

## 2018-08-03 NOTE — Op Note (Signed)
Date of procedure: 08/03/18  Preoperative diagnosis:  1. Bladder tumor  Postoperative diagnosis:  1. Same as above  Procedure: 1. TURBT, small 2. Bilateral retrograde pyelogram 3. Bladder biopsy 4. Instillation of intravesical mitomycin  Surgeon: Hollice Espy, MD  Anesthesia: General  Complications: None  Intraoperative findings: Proximally 1.5 cm papillary superficial appearing tumor on left lateral bladder wall adjacent to but not involving the left ureteral orifice.  Normal bilateral retrogrades.  2 focal areas of patchy erythema along the right bladder wall beyond right UO, biopsied.  EBL: Minimal  Specimens: Left lateral bladder wall tumor, right trigone biopsy  Drains: 16 French Foley catheter with 10 cc balloon  Indication: Kelly Merritt is a 72 y.o. patient with microscopic hematuria found to have papillary bladder tumor involving the left lateral bladder wall which was adjacent to an unclear whether or not it involve the orifice itself.  After reviewing the management options for treatment, she elected to proceed with the above surgical procedure(s). We have discussed the potential benefits and risks of the procedure, side effects of the proposed treatment, the likelihood of the patient achieving the goals of the procedure, and any potential problems that might occur during the procedure or recuperation. Informed consent has been obtained.  Description of procedure:  The patient was taken to the operating room and general anesthesia was induced.  The patient was placed in the dorsal lithotomy position, prepped and draped in the usual sterile fashion, and preoperative antibiotics were administered. A preoperative time-out was performed.   A 21 French scope was advanced per urethra into the bladder.  The bladder was carefully inspected.  This revealed the previously identified 1.5 cm papillary broad-based superficial appearing bladder tumor adjacent in close proximity  to the left ureteral orifice extending along the left lateral bladder wall but did not appear to involve the orifice itself.  There is clear reflux from the UO itself.  The remainder of the bladder was unremarkable other than 2 areas of somewhat suspicious erythema which were only several millimeters in diameter just beyond the right UO.  At this point time, bilateral retrograde pyelograms were performed.  This revealed no hydroureteronephrosis bilaterally no filling defects or concern for upper tract tumors.  Attention was then turned to the area on the right lateral bladder wall, 2 cold cup biopsies were used to biopsy the suspicious area were sent off first separate specimen.  Resectoscope was then brought in, 26 [place] and using the loop, hemostasis was achieved at the location of the bladder biopsies.  The loop was then used to completely resect the left lateral bladder wall tumor.  The UO itself did not need to be resected.  Careful and adequate hemostasis was achieved at the base of the tumor.  Care was taken to use judicious use of cautery around the UO itself.  At the end of the case, there is clear reflux of urine from both.  Finally the bladder was drained.  A 16 French Foley catheter was placed and the balloon was filled with 10 cc of sterile water.  The patient was then clean and dry, repositioned supine position, reversed from anesthesia, taken the PACU in stable condition.  40 mg of intravesical mitomycin was instilled to the bladder.  Was allowed to dwell for 1 hour.  This was well-tolerated.  After 1 hour, the bladder was drained and the catheter was removed.  Plan: I will call her with her pathology results as I anticipate this will likely be  superficial.  She is scheduled for follow-up cystoscopy in 3 months.  All questions answered.  Hollice Espy, M.D.

## 2018-08-03 NOTE — Anesthesia Postprocedure Evaluation (Signed)
Anesthesia Post Note  Patient: Kelly Merritt  Procedure(s) Performed: TRANSURETHRAL RESECTION OF BLADDER TUMOR (TURBT)- MITOMYCIN (N/A Bladder) CYSTOSCOPY WITH RETROGRADE PYELOGRAM (Left Ureter)  Patient location during evaluation: PACU Anesthesia Type: General Level of consciousness: awake and alert and oriented Pain management: pain level controlled Vital Signs Assessment: post-procedure vital signs reviewed and stable Respiratory status: spontaneous breathing, nonlabored ventilation and respiratory function stable Cardiovascular status: blood pressure returned to baseline and stable Postop Assessment: no signs of nausea or vomiting Anesthetic complications: no     Last Vitals:  Vitals:   08/03/18 1322 08/03/18 1337  BP: 111/74 117/77  Pulse: 72 63  Resp: (!) 22 16  Temp: (!) 36.2 C (!) 36 C  SpO2: 94% 98%    Last Pain:  Vitals:   08/03/18 1337  TempSrc: Temporal  PainSc: 0-No pain                 Ralf Konopka

## 2018-08-04 ENCOUNTER — Encounter: Payer: Self-pay | Admitting: Urology

## 2018-08-08 LAB — SURGICAL PATHOLOGY

## 2018-08-12 ENCOUNTER — Encounter: Payer: Self-pay | Admitting: Urology

## 2018-08-12 ENCOUNTER — Ambulatory Visit (INDEPENDENT_AMBULATORY_CARE_PROVIDER_SITE_OTHER): Payer: Medicare Other | Admitting: Urology

## 2018-08-12 ENCOUNTER — Other Ambulatory Visit: Payer: Self-pay

## 2018-08-12 VITALS — BP 146/81 | HR 69 | Ht 66.0 in | Wt 152.0 lb

## 2018-08-12 DIAGNOSIS — F17200 Nicotine dependence, unspecified, uncomplicated: Secondary | ICD-10-CM

## 2018-08-12 DIAGNOSIS — R350 Frequency of micturition: Secondary | ICD-10-CM

## 2018-08-12 DIAGNOSIS — C672 Malignant neoplasm of lateral wall of bladder: Secondary | ICD-10-CM | POA: Diagnosis not present

## 2018-08-12 NOTE — Progress Notes (Signed)
08/12/2018 10:15 AM   Raiford Simmonds November 10, 1945 [id number]  Referring provider: [facility] [place], [place] [address]  Chief Complaint  Patient presents with  . Follow-up    HPI: 72 year old female with a personal history of bladder cancer returns today following TURBT to discuss her pathology results.  She was found to have a papillary tumor located near the left hemitrigone on the left lateral wall measuring 1.5 cm.  She was taken to the operating room on 08/03/2018 for TURBT, B RTG, and mitomycin.    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  Post op, she has urinary frequency post op.  No dysuria or gross hematuria.  No fevers or chills.     PMH: Past Medical History:  Diagnosis Date  . Allergic rhinitis, cause unspecified   . Allergy   . Benign neoplasm of other specified sites of skin    Squamous Cell Carcinoma abdomen; Isenstein/derm  . Cancer ([place])    Squamous Cell Carcinoma; followed annually by Isenstein/[place]  . Depression   . Essential hypertension, benign   . Gastric ulcer, unspecified as acute or chronic, without mention of hemorrhage, perforation, or obstruction   . Headache(784.0)   . Heart murmur   . Migraine, unspecified, without mention of intractable migraine without mention of status migrainosus   . Mitral valve disorders(424.0)   . Osteoarthrosis, unspecified whether generalized or localized, lower leg    Severe OA hip.  . Other abnormal glucose   . Pneumonia   . Pure hypercholesterolemia   . Substance abuse ([place])    CIGARETTES  . Symptomatic menopausal or female climacteric states   . Tobacco use disorder     Surgical History: Past Surgical History:  Procedure Laterality Date  . BREAST BIOPSY  1970  . CARDIAC  CATHETERIZATION  10/2008  . CYSTOSCOPY W/ RETROGRADES Left 08/03/2018   Procedure: CYSTOSCOPY WITH RETROGRADE PYELOGRAM;  Surgeon: Hollice Espy, MD;  Location: [facility] ORS;  Service: Urology;  Laterality: Left;  . DILATION AND CURETTAGE OF UTERUS  1998   heavy menses  . JOINT REPLACEMENT  12/03/2012   L THR  . resection of squamous cell carcinoma forearm  2013   abdomen 2015  . TONSILLECTOMY AND ADENOIDECTOMY  1950  . TRANSURETHRAL RESECTION OF BLADDER TUMOR N/A 08/03/2018   Procedure: TRANSURETHRAL RESECTION OF BLADDER TUMOR (TURBT)- MITOMYCIN;  Surgeon: Hollice Espy, MD;  Location: [facility] ORS;  Service: Urology;  Laterality: N/A;    Home Medications:  Allergies as of 08/12/2018      Reactions   Indomethacin Other (See Comments)   SEVERE STOMACH CRAMPS AND BLOOD IN URINE   Lipitor [atorvastatin] Other (See Comments)   myalgias   Niacin And Related    Pain all over "covered in fire ants", lightheaded       Medication List        Accurate as of 08/12/18 10:15 AM. Always use your most recent med list.          amLODipine-atorvastatin 5-10 MG tablet Commonly known as:  CADUET Take 1 tablet by mouth daily.   amoxicillin 500 MG capsule Commonly known as:  AMOXIL Take 2,000 mg by mouth See admin instructions. 1 hour prior to dental visits   aspirin 81 MG tablet Take 81 mg by mouth daily.   CITRACAL +D3 PO Take 1  tablet by mouth at bedtime.   docusate sodium 100 MG capsule Commonly known as:  COLACE Take 1 capsule (100 mg total) by mouth 2 (two) times daily.   EQL OMEGA 3 FISH OIL 1400 MG Caps Take 2,800 mg by mouth at bedtime.   GLUCOSAMINE CHONDR 1500 COMPLX PO Take 2 tablets by mouth at bedtime.   HYDROcodone-acetaminophen 5-325 MG tablet Commonly known as:  NORCO/VICODIN Take 1-2 tablets by mouth every 6 (six) hours as needed for moderate pain.   ibuprofen 800 MG tablet Commonly known as:  ADVIL,MOTRIN Take 1 tablet (800 mg total) by mouth every 8 (eight)  hours as needed.   MULTIVITAMINS PO Take 1 tablet by mouth daily.   PRESCRIPTION MEDICATION Apply 1 application topically 2 (two) times daily. FLUOROURACIL 5% + CALCIPOTRIENE 0.005%   SF 5000 PLUS 1.1 % Crea dental cream Generic drug:  sodium fluoride Place 1 application onto teeth at bedtime.   vitamin C 1000 MG tablet Take 1,000 mg by mouth daily.       Allergies:  Allergies  Allergen Reactions  . Indomethacin Other (See Comments)    SEVERE STOMACH CRAMPS AND BLOOD IN URINE  . Lipitor [Atorvastatin] Other (See Comments)    myalgias  . Niacin And Related     Pain all over "covered in fire ants", lightheaded     Family History: Family History  Problem Relation Age of Onset  . Alcohol abuse Father   . Drug abuse Father   . Depression Father   . Cirrhosis Father   . Gout Father   . CAD Father        PTSD  . Heart disease Father 80       AMI/CABG  . Dementia Mother   . Hyperlipidemia Mother   . Hypertension Sister   . Hyperlipidemia Sister   . Leukemia Sister   . Hyperlipidemia Sister   . Hypertension Sister   . Heart disease Brother   . Hyperlipidemia Brother   . Hypertension Brother   . COPD Brother   . COPD Brother   . Cancer Sister        BOWEL  . Hyperlipidemia Sister   . Hypertension Sister   . Hyperlipidemia Sister   . Hypertension Sister   . Ovarian cancer Unknown   . Breast cancer Unknown     Social History:  reports that she has been smoking cigarettes. She started smoking about 51 years ago. She has a 25.50 pack-year smoking history. She has never used smokeless tobacco. She reports that she drinks alcohol. She reports that she does not use drugs.  ROS: UROLOGY Frequent Urination?: Yes Hard to postpone urination?: Yes Burning/pain with urination?: No Get up at night to urinate?: Yes Leakage of urine?: Yes Urine stream starts and stops?: No Trouble starting stream?: No Do you have to strain to urinate?: No Blood in urine?: No Urinary  tract infection?: No Sexually transmitted disease?: No Injury to kidneys or bladder?: No Painful intercourse?: No Weak stream?: No Currently pregnant?: No Vaginal bleeding?: No  Gastrointestinal Nausea?: No Vomiting?: No Indigestion/heartburn?: No Diarrhea?: No Constipation?: No  Constitutional Fever: No Night sweats?: No Weight loss?: No Fatigue?: No  Skin Skin rash/lesions?: No Itching?: No  Eyes Blurred vision?: No Double vision?: No  Ears/Nose/Throat Sore throat?: No Sinus problems?: No  Hematologic/Lymphatic Swollen glands?: No Easy bruising?: No  Cardiovascular Leg swelling?: No Chest pain?: No  Respiratory Cough?: No Shortness of breath?: No  Endocrine Excessive thirst?: No  Musculoskeletal  Back pain?: No Joint pain?: Yes  Neurological Headaches?: No Dizziness?: No  Psychologic Depression?: No Anxiety?: No  Physical Exam: BP (!) 146/81   Pulse 69   Ht 5\' 6"  (1.676 m)   Wt 152 lb (68.9 kg)   BMI 24.53 kg/m   Constitutional:  Alert and oriented, No acute distress. HEENT: [place] AT, moist mucus membranes.  Trachea midline, no masses. Cardiovascular: No clubbing, cyanosis, or edema. Respiratory: Normal respiratory effort, no increased work of breathing. Skin: No rashes, bruises or suspicious lesions. Neurologic: Grossly intact, no focal deficits, moving all 4 extremities. Psychiatric: Normal mood and affect.  Laboratory Data: Lab Results  Component Value Date   WBC 7.9 05/26/2018   HGB 15.9 05/26/2018   HCT 44.5 05/26/2018   MCV 91 05/26/2018   PLT 213 05/26/2018    Lab Results  Component Value Date   CREATININE 0.98 06/10/2018     Lab Results  Component Value Date   HGBA1C 5.6 05/11/2017    Urinalysis N/a  Pertinent Imaging: n/a  Assessment & Plan:    1. Malignant neoplasm of lateral wall of urinary bladder (HCC) LgTa TCC , pathology reviewed Surveillance cystoscopy in 3 months per guidelines  2. Urinary  frequency Improving- likely secondary to irrigation for TURBT If fails to resolve by next week, advised to call for UA/ UCx to r/o infection   3. Smoker Encouraged smoking cessation again today   Return in about 3 months (around 11/12/2018) for cysto.  Hollice Espy, MD  [facility] [address], [place], [place] [address] [phone]

## 2018-11-18 ENCOUNTER — Encounter: Payer: Self-pay | Admitting: Urology

## 2018-11-18 ENCOUNTER — Ambulatory Visit (INDEPENDENT_AMBULATORY_CARE_PROVIDER_SITE_OTHER): Payer: Medicare Other | Admitting: Urology

## 2018-11-18 ENCOUNTER — Other Ambulatory Visit: Payer: Self-pay

## 2018-11-18 ENCOUNTER — Other Ambulatory Visit
Admission: RE | Admit: 2018-11-18 | Discharge: 2018-11-18 | Disposition: A | Discharge status: Home / Self Care | Payer: Medicare Other | Attending: Urology | Admitting: Urology

## 2018-11-18 VITALS — BP 140/75 | HR 78 | Ht 66.0 in | Wt 149.0 lb

## 2018-11-18 DIAGNOSIS — F17200 Nicotine dependence, unspecified, uncomplicated: Secondary | ICD-10-CM

## 2018-11-18 DIAGNOSIS — C672 Malignant neoplasm of lateral wall of bladder: Secondary | ICD-10-CM | POA: Diagnosis present

## 2018-11-18 DIAGNOSIS — Z8551 Personal history of malignant neoplasm of bladder: Secondary | ICD-10-CM | POA: Diagnosis not present

## 2018-11-18 LAB — URINALYSIS, COMPLETE (UACMP) WITH MICROSCOPIC
Bacteria, UA: NONE SEEN
Bilirubin Urine: NEGATIVE
Glucose, UA: NEGATIVE mg/dL
HGB URINE DIPSTICK: NEGATIVE
Ketones, ur: NEGATIVE mg/dL
Nitrite: NEGATIVE
PROTEIN: NEGATIVE mg/dL
SPECIFIC GRAVITY, URINE: 1.01 (ref 1.005–1.030)
pH: 5.5 (ref 5.0–8.0)

## 2018-11-18 NOTE — Progress Notes (Signed)
   11/18/18  CC:  Chief Complaint  Patient presents with  . Cysto    HPI: 73 year old female with a personal history of bladder cancer returns today for surveillance cystoscopy.    She was found to have a papillary tumor located near the left hemitrigone on the left lateral wall measuring 1.5 cm.  She was taken to the operating room on 08/03/2018 for TURBT, B RTG, and mitomycin.    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  She is a smoker.   She tried cutting back to 6 cigarettes per day recently but is back up to her normal frequency.  Blood pressure 140/75, pulse 78, height 5\' 6"  (1.676 m), weight 149 lb (67.6 kg). NED. A&Ox3.   No respiratory distress   Abd soft, NT, ND Normal external genitalia with patent urethral meatus  Cystoscopy Procedure Note  Patient identification was confirmed, informed consent was obtained, and patient was prepped using Betadine solution.  Lidocaine jelly was administered per urethral meatus.    Procedure: - Flexible cystoscope introduced, without any difficulty.   - Thorough search of the bladder revealed:    normal urethral meatus    normal urothelium    no stones    no ulcers     no tumors    no urethral polyps    no trabeculation  - Ureteral orifices were normal in position and appearance.  She is stellate scars notable just beyond UO early.  Post-Procedure: - Patient tolerated the procedure well  Assessment/ Plan:  1. History of bladder cancer Personal history of low risk bladder cancer No evidence of disease today Per guidelines, recommend every 6 month cystoscopy  2. Smoker Discussed the causative relationship between smoking and bladder cancer Encourage smoking cessation again today    Return in about 6 months (around 05/19/2019) for cysto.  Hollice Espy, MD

## 2018-11-18 NOTE — Patient Instructions (Signed)
Cystoscopy    Cystoscopy is a procedure that is used to help diagnose and sometimes treat conditions that affect that lower urinary tract. The lower urinary tract includes the bladder and the tube that drains urine from the bladder out of the body (urethra). Cystoscopy is performed with a thin, tube-shaped instrument with a light and camera at the end (cystoscope). The cystoscope may be hard (rigid) or flexible, depending on the goal of the procedure.The cystoscope is inserted through the urethra, into the bladder.  Cystoscopy may be recommended if you have:   Urinary tractinfections that keep coming back (recurring).   Blood in the urine (hematuria).   Loss of bladder control (urinary incontinence) or an overactive bladder.   Unusual cells found in a urine sample.   A blockage in the urethra.   Painful urination.   An abnormality in the bladder found during an intravenous pyelogram (IVP) or CT scan.  Cystoscopy may also be done to remove a sample of tissue to be examined under a microscope (biopsy).  Tell a health care provider about:   Any allergies you have.   All medicines you are taking, including vitamins, herbs, eye drops, creams, and over-the-counter medicines.   Any problems you or family members have had with anesthetic medicines.   Any blood disorders you have.   Any surgeries you have had.   Any medical conditions you have.   Whether you are pregnant or may be pregnant.  What are the risks?  Generally, this is a safe procedure. However, problems may occur, including:   Infection.   Bleeding.   Allergic reactions to medicines.   Damage to other structures or organs.  What happens before the procedure?   Ask your health care provider about:  ? Changing or stopping your regular medicines. This is especially important if you are taking diabetes medicines or blood thinners.  ? Taking medicines such as aspirin and ibuprofen. These medicines can thin your blood. Do not take these medicines  before your procedure if your health care provider instructs you not to.   Follow instructions from your health care provider about eating or drinking restrictions.   You may be given antibiotic medicine to help prevent infection.   You may have an exam or testing, such as X-rays of the bladder, urethra, or kidneys.   You may have urine tests to check for signs of infection.   Plan to have someone take you home after the procedure.  What happens during the procedure?   To reduce your risk of infection,your health care team will wash or sanitize their hands.   You will be given one or more of the following:  ? A medicine to help you relax (sedative).  ? A medicine to numb the area (local anesthetic).   The area around the opening of your urethra will be cleaned.   The cystoscope will be passed through your urethra into your bladder.   Germ-free (sterile)fluid will flow through the cystoscope to fill your bladder. The fluid will stretch your bladder so that your surgeon can clearly examine your bladder walls.   The cystoscope will be removed and your bladder will be emptied.  The procedure may vary among health care providers and hospitals.  What happens after the procedure?   You may have some soreness or pain in your abdomen and urethra. Medicines will be available to help you.   You may have some blood in your urine.   Do not drive for   24 hours if you received a sedative.  This information is not intended to replace advice given to you by your health care provider. Make sure you discuss any questions you have with your health care provider.  Document Released: 10/16/2000 Document Revised: 07/30/2017 Document Reviewed: 09/05/2015  Elsevier Interactive Patient Education  2019 [employer]

## 2018-11-24 ENCOUNTER — Other Ambulatory Visit: Payer: Self-pay | Admitting: Family Medicine

## 2018-11-24 DIAGNOSIS — Z1231 Encounter for screening mammogram for malignant neoplasm of breast: Secondary | ICD-10-CM

## 2018-12-07 ENCOUNTER — Ambulatory Visit
Admission: RE | Admit: 2018-12-07 | Discharge: 2018-12-07 | Disposition: A | Discharge status: Home / Self Care | Payer: Medicare Other | Source: Ambulatory Visit | Attending: Family Medicine | Admitting: Family Medicine

## 2018-12-07 DIAGNOSIS — C679 Malignant neoplasm of bladder, unspecified: Secondary | ICD-10-CM | POA: Insufficient documentation

## 2018-12-07 DIAGNOSIS — Z1231 Encounter for screening mammogram for malignant neoplasm of breast: Secondary | ICD-10-CM | POA: Insufficient documentation

## 2018-12-07 HISTORY — DX: Malignant neoplasm of bladder, unspecified: C67.9

## 2018-12-08 ENCOUNTER — Other Ambulatory Visit: Payer: Self-pay | Admitting: Oncology

## 2018-12-08 DIAGNOSIS — Z1231 Encounter for screening mammogram for malignant neoplasm of breast: Secondary | ICD-10-CM

## 2019-05-16 ENCOUNTER — Other Ambulatory Visit: Payer: Self-pay | Admitting: *Deleted

## 2019-05-16 DIAGNOSIS — Z8551 Personal history of malignant neoplasm of bladder: Secondary | ICD-10-CM

## 2019-05-19 ENCOUNTER — Other Ambulatory Visit: Payer: Self-pay

## 2019-05-19 ENCOUNTER — Other Ambulatory Visit
Admission: RE | Admit: 2019-05-19 | Discharge: 2019-05-19 | Disposition: A | Discharge status: Home / Self Care | Payer: Medicare Other | Attending: Urology | Admitting: Urology

## 2019-05-19 ENCOUNTER — Encounter: Payer: Self-pay | Admitting: Urology

## 2019-05-19 ENCOUNTER — Ambulatory Visit (INDEPENDENT_AMBULATORY_CARE_PROVIDER_SITE_OTHER): Payer: Medicare Other | Admitting: Urology

## 2019-05-19 VITALS — BP 145/87 | HR 66 | Ht 66.0 in | Wt 149.0 lb

## 2019-05-19 DIAGNOSIS — Z8551 Personal history of malignant neoplasm of bladder: Secondary | ICD-10-CM | POA: Diagnosis not present

## 2019-05-19 LAB — URINALYSIS, COMPLETE (UACMP) WITH MICROSCOPIC
Bacteria, UA: NONE SEEN
Bilirubin Urine: NEGATIVE
Glucose, UA: NEGATIVE mg/dL
Hgb urine dipstick: NEGATIVE
Ketones, ur: NEGATIVE mg/dL
Leukocytes,Ua: NEGATIVE
Nitrite: NEGATIVE
Protein, ur: NEGATIVE mg/dL
RBC / HPF: NONE SEEN RBC/hpf (ref 0–5)
Specific Gravity, Urine: 1.025 (ref 1.005–1.030)
pH: 5.5 (ref 5.0–8.0)

## 2019-05-19 NOTE — Progress Notes (Signed)
   05/19/19  CC:  Chief Complaint  Patient presents with  . Cysto    HPI: 73 year old female with a personal history of bladder cancer returns today for surveillance cystoscopy.    She was found to have a papillary tumor located near the left hemitrigone on the left lateral wall measuring 1.5 cm.  She was taken to the operating room on 08/03/2018 for TURBT, B RTG, and mitomycin.    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  She is a smoker.   She was able to quit for 8 days but has resumed smoking.  Blood pressure 140/75, pulse 78, height 5\' 6"  (1.676 m), weight 149 lb (67.6 kg). NED. A&Ox3.   No respiratory distress   Abd soft, NT, ND Normal external genitalia with patent urethral meatus  Cystoscopy Procedure Note  Patient identification was confirmed, informed consent was obtained, and patient was prepped using Betadine solution.  Lidocaine jelly was administered per urethral meatus.    Procedure: - Flexible cystoscope introduced, without any difficulty.   - Thorough search of the bladder revealed:    normal urethral meatus    normal urothelium    no stones    no ulcers     no tumors    no urethral polyps    no trabeculation  - Ureteral orifices were normal in position and appearance.  She is stellate scars notable just beyond UO.Marland Kitchen  Post-Procedure: - Patient tolerated the procedure well  Assessment/ Plan:  1. History of bladder cancer Personal history of low risk bladder cancer No evidence of disease today Given low risk bladder cancer without recurrence, she may transition to annual cystoscopy  2. Smoker Discussed the causative relationship between smoking and bladder cancer Encourage smoking cessation again today    Return in about 1 year (around 05/18/2020) for cysto.  Hollice Espy,  MD

## 2019-12-06 ENCOUNTER — Other Ambulatory Visit: Payer: Self-pay | Admitting: Family Medicine

## 2019-12-06 DIAGNOSIS — Z1231 Encounter for screening mammogram for malignant neoplasm of breast: Secondary | ICD-10-CM

## 2019-12-21 ENCOUNTER — Ambulatory Visit: Payer: Medicare Other

## 2020-02-19 ENCOUNTER — Inpatient Hospital Stay: Admission: RE | Admit: 2020-02-19 | Payer: Medicare Other | Source: Ambulatory Visit

## 2020-05-23 NOTE — Progress Notes (Signed)
   05/24/2020  CC:  Chief Complaint  Patient presents with  . Cysto    HPI: Kelly Merritt is a 74 y.o. female with a personal history of bladder cancer returns today for surveillance cystoscopy.   She was found to have a papillary tumor located near the left hemitrigone on the left lateral wall measuring 1.5 cm. She was taken to the operating room on 08/03/2018 for TURBT, B RTG, and mitomycin.  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  She is a smoker.   She was able to quit for 8 days but has resumed smoking.  She denies urinary issues over the past year. Denies hematuria.     Blood pressure (!) 132/85, pulse 74, height 5\' 6"  (1.676 m), weight 157 lb (71.2 kg). NED. A&Ox3.   No respiratory distress   Abd soft, NT, ND Normal external genitalia with patent urethral meatus  Cystoscopy Procedure Note  Patient identification was confirmed, informed consent was obtained, and patient was prepped using Betadine solution.  Lidocaine jelly was administered per urethral meatus.    Procedure: - Flexible cystoscope introduced, without any difficulty.   - Thorough search of the bladder revealed:    normal urethral meatus    normal urothelium    no stones    no ulcers     no tumors    no urethral polyps    no trabeculation  - Ureteral orifices were normal in position and appearance.  Post-Procedure: - Patient tolerated the procedure well  Assessment/ Plan:  1. History of bladder cancer Personal history of low risk bladder cancer. No evidence of disease today.    Follow up in 1 year for repeat cystoscopy.    Fransico Him, am acting as a scribe for Dr. Hollice Espy.  I have reviewed the above documentation for accuracy and completeness, and I agree with the above.   Hollice Espy,  MD

## 2020-05-24 ENCOUNTER — Ambulatory Visit (INDEPENDENT_AMBULATORY_CARE_PROVIDER_SITE_OTHER): Payer: Medicare HMO | Admitting: Urology

## 2020-05-24 ENCOUNTER — Other Ambulatory Visit: Payer: Self-pay

## 2020-05-24 ENCOUNTER — Other Ambulatory Visit: Payer: Self-pay | Admitting: *Deleted

## 2020-05-24 ENCOUNTER — Other Ambulatory Visit
Admission: RE | Admit: 2020-05-24 | Discharge: 2020-05-24 | Disposition: A | Discharge status: Home / Self Care | Payer: Medicare HMO | Attending: Urology | Admitting: Urology

## 2020-05-24 ENCOUNTER — Encounter: Payer: Self-pay | Admitting: Urology

## 2020-05-24 VITALS — BP 132/85 | HR 74 | Ht 66.0 in | Wt 157.0 lb

## 2020-05-24 DIAGNOSIS — Z8551 Personal history of malignant neoplasm of bladder: Secondary | ICD-10-CM | POA: Diagnosis not present

## 2020-05-24 LAB — URINALYSIS, COMPLETE (UACMP) WITH MICROSCOPIC
Bilirubin Urine: NEGATIVE
Glucose, UA: NEGATIVE mg/dL
Hgb urine dipstick: NEGATIVE
Ketones, ur: NEGATIVE mg/dL
Leukocytes,Ua: NEGATIVE
Nitrite: NEGATIVE
Protein, ur: NEGATIVE mg/dL
Specific Gravity, Urine: 1.025 (ref 1.005–1.030)
pH: 5 (ref 5.0–8.0)

## 2020-12-18 ENCOUNTER — Other Ambulatory Visit: Payer: Self-pay | Admitting: Family Medicine

## 2020-12-18 DIAGNOSIS — Z1231 Encounter for screening mammogram for malignant neoplasm of breast: Secondary | ICD-10-CM

## 2021-02-06 ENCOUNTER — Other Ambulatory Visit: Payer: Self-pay

## 2021-02-06 ENCOUNTER — Ambulatory Visit
Admission: RE | Admit: 2021-02-06 | Discharge: 2021-02-06 | Disposition: A | Discharge status: Home / Self Care | Payer: Medicare HMO | Source: Ambulatory Visit | Attending: Family Medicine | Admitting: Family Medicine

## 2021-02-06 DIAGNOSIS — Z1231 Encounter for screening mammogram for malignant neoplasm of breast: Secondary | ICD-10-CM | POA: Diagnosis present

## 2021-05-22 ENCOUNTER — Other Ambulatory Visit: Payer: Self-pay

## 2021-05-22 DIAGNOSIS — Z8551 Personal history of malignant neoplasm of bladder: Secondary | ICD-10-CM

## 2021-05-23 ENCOUNTER — Other Ambulatory Visit
Admission: RE | Admit: 2021-05-23 | Discharge: 2021-05-23 | Disposition: A | Discharge status: Home / Self Care | Payer: Medicare HMO | Attending: Urology | Admitting: Urology

## 2021-05-23 ENCOUNTER — Ambulatory Visit (INDEPENDENT_AMBULATORY_CARE_PROVIDER_SITE_OTHER): Payer: Medicare HMO | Admitting: Urology

## 2021-05-23 ENCOUNTER — Other Ambulatory Visit: Payer: Self-pay

## 2021-05-23 VITALS — BP 146/87 | HR 107 | Ht 66.5 in | Wt 157.0 lb

## 2021-05-23 DIAGNOSIS — Z8551 Personal history of malignant neoplasm of bladder: Secondary | ICD-10-CM | POA: Diagnosis present

## 2021-05-23 DIAGNOSIS — R3915 Urgency of urination: Secondary | ICD-10-CM | POA: Diagnosis not present

## 2021-05-23 LAB — URINALYSIS, COMPLETE (UACMP) WITH MICROSCOPIC
Bilirubin Urine: NEGATIVE
Glucose, UA: NEGATIVE mg/dL
Ketones, ur: NEGATIVE mg/dL
Nitrite: NEGATIVE
Protein, ur: NEGATIVE mg/dL
Specific Gravity, Urine: 1.02 (ref 1.005–1.030)
pH: 5 (ref 5.0–8.0)

## 2021-05-23 NOTE — Progress Notes (Signed)
   05/23/21   CC:  Chief Complaint  Patient presents with   Cysto     HPI: Kelly Merritt is a 75 y.o. female with a personal history of bladder cancer returns today for surveillance cystoscopy.   She was found to have a papillary tumor located near the left hemitrigone on the left lateral wall measuring 1.5 cm.  She was taken to the operating room on 08/03/2018 for TURBT, B RTG, and mitomycin.     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   She is a smoker.     She reports today that she has had some urgency frequency and occasional urge incontinence.  She is not interested in medication for this.    Blood pressure (!) 146/87, pulse (!) 107, height 5' 6.5" (1.689 m), weight 157 lb (71.2 kg). NED. A&Ox3.   No respiratory distress   Abd soft, NT, ND Normal external genitalia with patent urethral meatus  Cystoscopy Procedure Note  Patient identification was confirmed, informed consent was obtained, and patient was prepped using Betadine solution.  Lidocaine jelly was administered per urethral meatus.    Procedure: - Flexible cystoscope introduced, without any difficulty.   - Thorough search of the bladder revealed:    normal urethral meatus    normal urothelium    no stones    no ulcers     no tumors    no urethral polyps    no trabeculation  - Ureteral orifices were normal in position and appearance.  Post-Procedure: - Patient tolerated the procedure well  Assessment/ Plan:  1. History of bladder cancer Personal history of low risk bladder cancer. No evidence of disease today.   2.  Urinary urgency We discussed behavioral modification, not interested in anticholinergic or beta 3 agonist at this time, she will let us know if her symptoms worsen or she would like to pursue this   Follow up  in 1 year for repeat cystoscopy.     Hollice Espy, MD

## 2021-11-04 NOTE — Progress Notes (Signed)
11/05/21 4:41 PM   Kelly Merritt 26-Dec-1945 [id number]  Referring provider:  [facility] [place],  [place] [id number] Chief Complaint  Patient presents with   Dysuria     HPI: Kelly Merritt is a 76 y.o.female with a personal history of bladder cancer and urinary urgency, who presents today for possible UTI and urinary frequency.   She was found to have a papillary tumor located near the left hemitrigone on the left lateral wall measuring 1.5 cm.  She was taken to the operating room on 08/03/2018 for TURBT, B RTG, and mitomycin.     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   She is a smoker.     She reports today that she is having back pain. She denies any fever and she is unsure if she is having any bladder pain. She denies any vomiting a nausea. She had diarrhea about 2 days ago she denies history of diverticulitis.  She denies having any fevers.  She feels like her back pain may be her kidneys and that is why she is coming to urology today.  She has no lower urinary tract symptoms.   PMH: Past Medical History:  Diagnosis Date   Allergic rhinitis, cause unspecified    Allergy    Benign neoplasm of other specified sites of skin    Squamous Cell Carcinoma abdomen; [place]/derm   Bladder cancer ([place])    2019   Cancer ([place])    Squamous Cell Carcinoma; followed annually by [facility]/[place]   Depression    Essential hypertension, benign    Gastric ulcer, unspecified as acute or chronic, without mention of hemorrhage, perforation, or obstruction    Headache(784.0)    Heart murmur    Migraine, unspecified, without mention of intractable migraine without mention of status migrainosus    Mitral valve disorders(424.0)    Osteoarthrosis, unspecified  whether generalized or localized, lower leg    Severe OA hip.   Other abnormal glucose    Pneumonia    Pure hypercholesterolemia    Substance abuse ([place])    CIGARETTES   Symptomatic menopausal or female climacteric states    Tobacco use disorder     Surgical History: Past Surgical History:  Procedure Laterality Date   BREAST BIOPSY Left 1970   neg   CARDIAC CATHETERIZATION  10/2008   CYSTOSCOPY W/ RETROGRADES Left 08/03/2018   Procedure: CYSTOSCOPY WITH RETROGRADE PYELOGRAM;  Surgeon: Hollice Espy, MD;  Location: [facility] ORS;  Service: Urology;  Laterality: Left;   DILATION AND CURETTAGE OF UTERUS  1998   heavy menses   JOINT REPLACEMENT  12/03/2012   L THR   resection of squamous cell carcinoma forearm  2013   abdomen 2015   TONSILLECTOMY AND ADENOIDECTOMY  1950   TRANSURETHRAL RESECTION OF BLADDER TUMOR N/A 08/03/2018   Procedure: TRANSURETHRAL RESECTION OF BLADDER TUMOR (TURBT)- MITOMYCIN;  Surgeon: Hollice Espy, MD;  Location: [facility] ORS;  Service: Urology;  Laterality: N/A;    Home Medications:  Allergies as of 11/05/2021       Reactions   Indomethacin Other (See Comments)   SEVERE STOMACH CRAMPS AND BLOOD IN URINE   Lipitor [atorvastatin] Other (See Comments)   myalgias   Niacin And Related    Pain all over "covered in fire ants", lightheaded  Medication List        Accurate as of November 05, 2021  4:41 PM. If you have any questions, ask your nurse or doctor.          amLODipine-atorvastatin 5-10 MG tablet Commonly known as: Caduet Take 1 tablet by mouth daily.   amoxicillin 500 MG capsule Commonly known as: AMOXIL Take 2,000 mg by mouth See admin instructions. 1 hour prior to dental visits   aspirin 81 MG tablet Take 81 mg by mouth daily.   Biotin 1 MG Caps Take by mouth.   CITRACAL +D3 PO Take 1 tablet by mouth at bedtime.   Elderberry 500 MG Caps Take by mouth.   EQL Omega 3 Fish Oil 1400 MG Caps Take 2,800 mg by mouth at bedtime.    GLUCOSAMINE CHONDR 1500 COMPLX PO Take 2 tablets by mouth at bedtime.   ibuprofen 800 MG tablet Commonly known as: ADVIL Take 800 mg by mouth every 8 (eight) hours as needed.   MULTIVITAMINS PO Take 1 tablet by mouth daily.   vitamin C 1000 MG tablet Take 1,000 mg by mouth daily.        Allergies:  Allergies  Allergen Reactions   Indomethacin Other (See Comments)    SEVERE STOMACH CRAMPS AND BLOOD IN URINE   Lipitor [Atorvastatin] Other (See Comments)    myalgias   Niacin And Related     Pain all over "covered in fire ants", lightheaded     Family History: Family History  Problem Relation Age of Onset   Alcohol abuse Father    Drug abuse Father    Depression Father    Cirrhosis Father    Gout Father    CAD Father        PTSD   Heart disease Father 62       AMI/CABG   Dementia Mother    Hyperlipidemia Mother    Hypertension Sister    Hyperlipidemia Sister    Leukemia Sister    Hyperlipidemia Sister    Hypertension Sister    Heart disease Brother    Hyperlipidemia Brother    Hypertension Brother    COPD Brother    COPD Brother    Cancer Sister        BOWEL   Hyperlipidemia Sister    Hypertension Sister    Hyperlipidemia Sister    Hypertension Sister    Ovarian cancer Other    Breast cancer Maternal Grandmother    Breast cancer Maternal Aunt     Social History:  reports that she has been smoking cigarettes. She started smoking about 55 years ago. She has a 25.50 pack-year smoking history. She has never used smokeless tobacco. She reports current alcohol use. She reports that she does not use drugs.   Physical Exam: BP (!) 171/92    Pulse 68    Ht 5' 6.5" (1.689 m)    Wt 164 lb 12.8 oz (74.8 kg)    BMI 26.20 kg/m   Constitutional:  Alert and oriented, No acute distress. HEENT: [place] AT, moist mucus membranes.  Trachea midline, no masses. Cardiovascular: No clubbing, cyanosis, or edema. Respiratory: Normal respiratory effort, no increased work of  breathing. Skin: No rashes, bruises or suspicious lesions. Neurologic: Grossly intact, no focal deficits, moving all 4 extremities. Psychiatric: Normal mood and affect.  Laboratory Data:  Lab Results  Component Value Date   CREATININE 0.98 06/10/2018   Lab Results  Component Value Date   HGBA1C 5.6 05/11/2017  Urinalysis 6-10 WBCs otherwise unremarkable   Pertinent Imaging: Results for orders placed or performed in visit on 11/05/21  BLADDER SCAN AMB NON-IMAGING  Result Value Ref Range   Scan Result 0 ml    Assessment & Plan:    Back/abdominal pain  - Bothersome  - She is voiding adequately  - Urinalysis is unremarkable, not suspect UTI as cause of abdominal pain nausea vomiting and diarrhea - Urine sent for culture - She was sent to the ER probably needs cross sectional imagine due to the degree of her pain along with labs she appears quite unwell today   I,Kailey Littlejohn,acting as a scribe for Hollice Espy, MD.,have documented all relevant documentation on the behalf of Hollice Espy, MD,as directed by  Hollice Espy, MD while in the presence of Hollice Espy, MD.  I have reviewed the above documentation for accuracy and completeness, and I agree with the above.   Hollice Espy, MD   [facility] [address], [place], [place] [address] [phone]

## 2021-11-05 ENCOUNTER — Encounter: Payer: Self-pay | Admitting: Urology

## 2021-11-05 ENCOUNTER — Ambulatory Visit (INDEPENDENT_AMBULATORY_CARE_PROVIDER_SITE_OTHER): Payer: Medicare HMO | Admitting: Urology

## 2021-11-05 ENCOUNTER — Other Ambulatory Visit: Payer: Self-pay

## 2021-11-05 VITALS — BP 171/92 | HR 68 | Ht 66.5 in | Wt 164.8 lb

## 2021-11-05 DIAGNOSIS — R300 Dysuria: Secondary | ICD-10-CM | POA: Diagnosis not present

## 2021-11-05 LAB — BLADDER SCAN AMB NON-IMAGING: Scan Result: 0

## 2021-11-07 LAB — URINALYSIS, COMPLETE
Bilirubin, UA: NEGATIVE
Glucose, UA: NEGATIVE
Ketones, UA: NEGATIVE
Nitrite, UA: NEGATIVE
Protein,UA: NEGATIVE
RBC, UA: NEGATIVE
Specific Gravity, UA: 1.025 (ref 1.005–1.030)
Urobilinogen, Ur: 0.2 mg/dL (ref 0.2–1.0)
pH, UA: 5.5 (ref 5.0–7.5)

## 2021-11-07 LAB — CULTURE, URINE COMPREHENSIVE

## 2021-11-07 LAB — MICROSCOPIC EXAMINATION: RBC, Urine: NONE SEEN /hpf (ref 0–2)

## 2021-11-08 DIAGNOSIS — M25551 Pain in right hip: Secondary | ICD-10-CM | POA: Insufficient documentation

## 2021-11-08 DIAGNOSIS — Z8551 Personal history of malignant neoplasm of bladder: Secondary | ICD-10-CM | POA: Insufficient documentation

## 2021-11-08 DIAGNOSIS — R1030 Lower abdominal pain, unspecified: Secondary | ICD-10-CM | POA: Insufficient documentation

## 2021-11-08 DIAGNOSIS — M549 Dorsalgia, unspecified: Secondary | ICD-10-CM | POA: Diagnosis present

## 2021-11-08 DIAGNOSIS — M545 Low back pain, unspecified: Secondary | ICD-10-CM | POA: Diagnosis not present

## 2021-11-08 LAB — COMPREHENSIVE METABOLIC PANEL
ALT: 25 U/L (ref 0–44)
AST: 31 U/L (ref 15–41)
Albumin: 4.1 g/dL (ref 3.5–5.0)
Alkaline Phosphatase: 59 U/L (ref 38–126)
Anion gap: 7 (ref 5–15)
BUN: 13 mg/dL (ref 8–23)
CO2: 28 mmol/L (ref 22–32)
Calcium: 9.2 mg/dL (ref 8.9–10.3)
Chloride: 102 mmol/L (ref 98–111)
Creatinine, Ser: 0.85 mg/dL (ref 0.44–1.00)
GFR, Estimated: >60 mL/min (ref >60)
Glucose, Bld: 146 mg/dL — ABNORMAL HIGH (ref 70–99)
Potassium: 3.3 mmol/L — ABNORMAL LOW (ref 3.5–5.1)
Sodium: 137 mmol/L (ref 135–145)
Total Bilirubin: 0.7 mg/dL (ref 0.3–1.2)
Total Protein: 6.8 g/dL (ref 6.5–8.1)

## 2021-11-08 LAB — CBC WITH DIFFERENTIAL/PLATELET
Abs Immature Granulocytes: 0.03 10*3/uL (ref 0.00–0.07)
Basophils Absolute: 0.1 10*3/uL (ref 0.0–0.1)
Basophils Relative: 1 %
Eosinophils Absolute: 0.1 10*3/uL (ref 0.0–0.5)
Eosinophils Relative: 1 %
HCT: 44.5 % (ref 36.0–46.0)
Hemoglobin: 15.4 g/dL — ABNORMAL HIGH (ref 12.0–15.0)
Immature Granulocytes: 0 %
Lymphocytes Relative: 24 %
Lymphs Abs: 2.4 10*3/uL (ref 0.7–4.0)
MCH: 31.8 pg (ref 26.0–34.0)
MCHC: 34.6 g/dL (ref 30.0–36.0)
MCV: 91.9 fL (ref 80.0–100.0)
Monocytes Absolute: 0.8 10*3/uL (ref 0.1–1.0)
Monocytes Relative: 8 %
Neutro Abs: 6.5 10*3/uL (ref 1.7–7.7)
Neutrophils Relative %: 66 %
Platelets: 191 10*3/uL (ref 150–400)
RBC: 4.84 MIL/uL (ref 3.87–5.11)
RDW: 12.2 % (ref 11.5–15.5)
WBC: 9.9 10*3/uL (ref 4.0–10.5)
nRBC: 0 % (ref 0.0–0.2)

## 2021-11-08 NOTE — ED Triage Notes (Addendum)
Pt presents via POV with chronic right sided back pain - She was seen at [facility] for the same and was prescribed antibiotics and pain meds but she has not been able to take the pain meds due to a decrease in appetite. Per her Urologist she was negative for UTI but was being treated for a UTI after being seen at [facility] two days ago. She notes she was able to take Oxycodone around 2000 today. Denies CP or SOB.

## 2021-11-09 ENCOUNTER — Emergency Department
Admission: EM | Admit: 2021-11-09 | Discharge: 2021-11-09 | Disposition: A | Discharge status: Home / Self Care | Payer: Medicare HMO | Attending: Emergency Medicine | Admitting: Emergency Medicine

## 2021-11-09 ENCOUNTER — Emergency Department: Payer: Medicare HMO

## 2021-11-09 DIAGNOSIS — M545 Low back pain, unspecified: Secondary | ICD-10-CM | POA: Diagnosis not present

## 2021-11-09 DIAGNOSIS — R1030 Lower abdominal pain, unspecified: Secondary | ICD-10-CM

## 2021-11-09 DIAGNOSIS — M25551 Pain in right hip: Secondary | ICD-10-CM

## 2021-11-09 LAB — URINALYSIS, ROUTINE W REFLEX MICROSCOPIC
Bilirubin Urine: NEGATIVE
Glucose, UA: NEGATIVE mg/dL
Ketones, ur: NEGATIVE mg/dL
Nitrite: NEGATIVE
Protein, ur: 30 mg/dL — AB
RBC / HPF: >50 RBC/hpf — ABNORMAL HIGH (ref 0–5)
Specific Gravity, Urine: 1.018 (ref 1.005–1.030)
pH: 5 (ref 5.0–8.0)

## 2021-11-09 MED ORDER — SODIUM CHLORIDE 0.9 % IV BOLUS
500.0000 mL | Freq: Once | INTRAVENOUS | Status: AC
Start: 2021-11-09 — End: 2021-11-09
  Administered 2021-11-09: 500 mL via INTRAVENOUS

## 2021-11-09 MED ORDER — ONDANSETRON HCL 4 MG/2ML IJ SOLN
4.0000 mg | INTRAMUSCULAR | Status: AC
  Administered 2021-11-09: 4 mg via INTRAVENOUS
  Filled 2021-11-09: qty 2

## 2021-11-09 MED ORDER — MORPHINE SULFATE (PF) 4 MG/ML IV SOLN
4.0000 mg | Freq: Once | INTRAVENOUS | Status: AC
  Administered 2021-11-09: 4 mg via INTRAVENOUS
  Filled 2021-11-09: qty 1

## 2021-11-09 MED ORDER — IOHEXOL 300 MG/ML  SOLN
100.0000 mL | Freq: Once | INTRAMUSCULAR | Status: AC | PRN
  Administered 2021-11-09: 100 mL via INTRAVENOUS
  Filled 2021-11-09: qty 100

## 2021-11-09 MED ORDER — ONDANSETRON 4 MG PO TBDP: ORAL_TABLET | ORAL | 0 refills | Status: DC

## 2021-11-09 MED ORDER — DOCUSATE SODIUM 100 MG PO CAPS: ORAL_CAPSULE | ORAL | 0 refills | Status: DC

## 2021-11-09 MED ORDER — OXYCODONE-ACETAMINOPHEN 5-325 MG PO TABS: 2.0000 | ORAL_TABLET | Freq: Four times a day (QID) | ORAL | 0 refills | Status: DC | PRN

## 2021-11-09 NOTE — ED Notes (Signed)
Pt updated on wait, pt verbalizes understanding.

## 2021-11-09 NOTE — ED Provider Notes (Signed)
[facility] Provider Note    Event Date/Time   First MD Initiated Contact with Patient 11/09/21 0340     (approximate)   History   Back Pain   HPI  Kelly Merritt is a 76 y.o. female with a medical history that includes but is not limited to bladder cancer status post treatment in 2019 by Dr. Erlene Quan.  She presents for evaluation of generalized lower abdominal pain, back pain, and right hip pain.  She also had 1 recent episode of loose stool and has had some nausea.  She has not had any chest pain or shortness of breath.  She was seen by Dr. Erlene Quan 4 days ago in clinic for the back pain and was found to have some hematuria and a questionable UTI.  Dr. Erlene Quan did not feel that the urinalysis was suggestive of a urinary tract infection and sent a urine culture.  However she did recommend the patient be evaluated with a CT scan for the backslash abdominal pain and she was sent to the emergency department.  The patient went to the [facility] emergency department and they started her on antibiotics for urinary tract infection given the abnormal urinalysis and obtained a CT renal stone protocol without contrast which was unrevealing.  They also gave her 4 tablets of Percocet and discharge her.  She presents tonight, approximately 3 days later, for persistent if not worsening pain.  The pain has now migrated and is mostly in her right hip but she still has abdominal and low back pain as well.  She has not been eating or drinking very much.  She has not had a fever nor dysuria.  Nothing particular makes the symptoms better or worse and she reports that the pain is severe and she is very frustrated.     Physical Exam   Triage Vital Signs: ED Triage Vitals  Enc Vitals Group     BP 11/08/21 2121 133/82     Pulse Rate 11/08/21 2121 66     Resp 11/08/21 2121 18     Temp 11/08/21 2121 97.8 F (36.6 C)     Temp Source 11/08/21 2121 Oral     SpO2 11/08/21 2121 94 %      Weight 11/08/21 2133 72.6 kg (160 lb)     Height 11/08/21 2133 1.676 m (5\' 6" )     Head Circumference --      Peak Flow --      Pain Score 11/08/21 2132 4     Pain Loc --      Pain Edu? --      Excl. in [place]? --     Most recent vital signs: Vitals:   11/08/21 2121 11/09/21 0035  BP: 133/82 137/89  Pulse: 66 61  Resp: 18 16  Temp: 97.8 F (36.6 C)   SpO2: 94% 93%     General: Awake, appears uncomfortable. CV:  Good peripheral perfusion.  Normal rate and rhythm. Resp:  Normal effort.  Abd:  No distention.  Mild tenderness to palpation in the right lower quadrant but without rebound or guarding. MSK:  No visible deformity of the right lower extremity.  She reports pain in the right hip but has no significant tenderness to palpation and no palpable deformities.  There is no shortening or external rotation.  She has not had any recent fall or trauma of which she is aware.   ED Results / Procedures / Treatments   Labs (all labs ordered  are listed, but only abnormal results are displayed) Labs Reviewed  URINALYSIS, ROUTINE W REFLEX MICROSCOPIC - Abnormal; Notable for the following components:      Result Value   Color, Urine YELLOW (*)    APPearance CLOUDY (*)    Hgb urine dipstick LARGE (*)    Protein, ur 30 (*)    Leukocytes,Ua SMALL (*)    RBC / HPF >50 (*)    Bacteria, UA RARE (*)    All other components within normal limits  CBC WITH DIFFERENTIAL/PLATELET - Abnormal; Notable for the following components:   Hemoglobin 15.4 (*)    All other components within normal limits  COMPREHENSIVE METABOLIC PANEL - Abnormal; Notable for the following components:   Potassium 3.3 (*)    Glucose, Bld 146 (*)    All other components within normal limits  URINE CULTURE    RADIOLOGY Personally reviewed the patient's CT scans of the abdomen/pelvis and right hip and there is no evidence of acute abnormality.  Radiologist pointed out a small intrarenal stone but it does not explain the  patient's symptoms.    PROCEDURES:  Critical Care performed: No  Procedures   MEDICATIONS ORDERED IN ED: Medications  morphine 4 MG/ML injection 4 mg (4 mg Intravenous Given 11/09/21 0501)  ondansetron (ZOFRAN) injection 4 mg (4 mg Intravenous Given 11/09/21 0500)  sodium chloride 0.9 % bolus 500 mL (500 mLs Intravenous New Bag/Given 11/09/21 0500)  iohexol (OMNIPAQUE) 300 MG/ML solution 100 mL (100 mLs Intravenous Contrast Given 11/09/21 0506)     IMPRESSION / MDM / ASSESSMENT AND PLAN / ED COURSE  I reviewed the triage vital signs and the nursing notes.                              Differential diagnosis includes, but is not limited to, UTI, pyelonephritis, STD/PID, occult fracture of the right hip, pelvic fracture, appendicitis, diverticulitis, musculoskeletal pain, neoplasm.  Vital signs are stable and within normal limits including no tachycardia and no hypotension.  Patient has been waiting for more than 7 hours prior to being seen due to overwhelming ED and hospital volumes and limited staffing and she has not had a decline clinically although she reports that her pain is getting worse.  I reviewed the clinic note from Dr. Erlene Quan from 4 days ago and the [facility] emergency department note as well as the results of the CT scan she had at [facility] and there was no evidence of any acute or emergent abnormality.  She was given a prescription for four Percocet but that does not seem to have helped her pain.  Labs ordered tonight as part of her work-up included urinalysis, comprehensive metabolic panel, and CBC.  Her CBC is reassuring with no leukocytosis, possible slight hemoconcentration based on a hemoglobin of 15.4.  Comprehensive metabolic panel is generally reassuring as well with a very slightly decreased potassium of 3.3 but otherwise normal.  Urinalysis is consistent with her prior urinalysis; she has hematuria but no obvious infection.  I reviewed the urine culture from her prior visit and  there was minimal mixed flora, no obvious pathogenic bacteria.  I do not believe the patient is suffering from UTI nor pyelonephritis.  Her noncontrasted scan 3 to 4 days ago was unremarkable but she is having worsening pain in her right lower quadrant.  After discussing the plan with the patient and her daughter, I am ordering a CT of the abdomen  and pelvis with IV contrast as well as a CT of her hip since that seems to be a location of substantial discomfort for her.  I am treating her with a normal saline 500 mL bolus, morphine 4 mg IV, and Zofran 4 mg IV.  I will reassess after the imaging.  The patient and her daughter understand and agree with the plan.     Clinical Course as of 11/09/21 0630  Nancy Fetter Nov 09, 2021  0626 I personally reviewed the patient's CT scans of the abdomen/pelvis and right hip.  I see no evidence of acute abnormality.  The radiologist mentioned a small intrarenal stone but which does not account for the patient's symptoms and was present on the prior scan 4 days ago.  No acute changes or evidence of acute or emergent abnormality.  Abdomen the patient and her daughter.  The patient is feeling much better after her dose of morphine.  I explained that there is no evidence of an acute or emergent issue, and even though I considered admission, there is no indication she would benefit from it as there is no clear explanation from her symptoms and she is hemodynamically stable.  She understands and agrees with the plan for outpatient follow-up.  Prescriptions as listed below.  I gave my usual and customary return precautions. [CF]    Clinical Course User Index [CF] Hinda Kehr, MD     FINAL CLINICAL IMPRESSION(S) / ED DIAGNOSES   Final diagnoses:  Lower abdominal pain  Low back pain without sciatica, unspecified back pain laterality, unspecified chronicity  Right hip pain     Rx / DC Orders   ED Discharge Orders          Ordered    oxyCODONE-acetaminophen  (PERCOCET) 5-325 MG tablet  Every 6 hours PRN        11/09/21 0628    ondansetron (ZOFRAN-ODT) 4 MG disintegrating tablet        11/09/21 0628    docusate sodium (COLACE) 100 MG capsule        11/09/21 4497             Note:  This document was prepared using Dragon voice recognition software and may include unintentional dictation errors.   Hinda Kehr, MD 11/09/21 0630

## 2021-11-09 NOTE — Discharge Instructions (Signed)
Your workup in the Emergency Department today was reassuring.  We did not find any specific abnormalities.  We recommend you drink plenty of fluids, take your regular medications and/or any new ones prescribed today, and follow up with the doctor(s) listed in these documents as recommended.  Return to the Emergency Department if you develop new or worsening symptoms that concern you.  

## 2021-11-09 NOTE — ED Notes (Signed)
Pt transported to CT ?

## 2021-11-10 LAB — URINE CULTURE: Culture: NO GROWTH

## 2021-11-14 DIAGNOSIS — I700 Atherosclerosis of aorta: Secondary | ICD-10-CM | POA: Insufficient documentation

## 2021-12-24 ENCOUNTER — Other Ambulatory Visit: Payer: Self-pay | Admitting: Family Medicine

## 2021-12-24 DIAGNOSIS — Z1231 Encounter for screening mammogram for malignant neoplasm of breast: Secondary | ICD-10-CM

## 2022-02-16 ENCOUNTER — Ambulatory Visit
Admission: RE | Admit: 2022-02-16 | Discharge: 2022-02-16 | Disposition: A | Discharge status: Home / Self Care | Payer: Medicare PPO | Source: Ambulatory Visit | Attending: Family Medicine | Admitting: Family Medicine

## 2022-02-16 DIAGNOSIS — Z1231 Encounter for screening mammogram for malignant neoplasm of breast: Secondary | ICD-10-CM | POA: Insufficient documentation

## 2022-05-29 ENCOUNTER — Other Ambulatory Visit: Payer: Self-pay | Admitting: Urology

## 2022-06-11 ENCOUNTER — Other Ambulatory Visit: Payer: Self-pay

## 2022-06-11 DIAGNOSIS — R300 Dysuria: Secondary | ICD-10-CM

## 2022-06-12 ENCOUNTER — Other Ambulatory Visit
Admission: RE | Admit: 2022-06-12 | Discharge: 2022-06-12 | Disposition: A | Discharge status: Home / Self Care | Attending: Urology | Admitting: Urology

## 2022-06-12 ENCOUNTER — Encounter: Payer: Self-pay | Admitting: Urology

## 2022-06-12 ENCOUNTER — Ambulatory Visit (INDEPENDENT_AMBULATORY_CARE_PROVIDER_SITE_OTHER): Payer: Medicare PPO | Admitting: Urology

## 2022-06-12 VITALS — BP 118/75 | HR 68 | Ht 65.0 in | Wt 165.0 lb

## 2022-06-12 DIAGNOSIS — N8110 Cystocele, unspecified: Secondary | ICD-10-CM | POA: Diagnosis not present

## 2022-06-12 DIAGNOSIS — R300 Dysuria: Secondary | ICD-10-CM

## 2022-06-12 DIAGNOSIS — Z8551 Personal history of malignant neoplasm of bladder: Secondary | ICD-10-CM | POA: Diagnosis not present

## 2022-06-12 LAB — URINALYSIS, COMPLETE (UACMP) WITH MICROSCOPIC
Bilirubin Urine: NEGATIVE
Glucose, UA: NEGATIVE mg/dL
Hgb urine dipstick: NEGATIVE
Ketones, ur: NEGATIVE mg/dL
Leukocytes,Ua: NEGATIVE
Nitrite: NEGATIVE
Protein, ur: NEGATIVE mg/dL
Specific Gravity, Urine: 1.015 (ref 1.005–1.030)
Squamous Epithelial / HPF: NONE SEEN (ref 0–5)
pH: 6 (ref 5.0–8.0)

## 2022-06-12 NOTE — Progress Notes (Signed)
    06/12/22  CC:  Chief Complaint  Patient presents with   Cysto          HPI: Kelly Merritt is a 76 y.o.female  with a personal history of bladder cancer and urinary urgency returns today for surveillance cystoscopy.    She was found to have a papillary tumor located near the left hemitrigone on the left lateral wall measuring 1.5 cm.  She was taken to the operating room on 08/03/2018 for TURBT, B RTG, and mitomycin.     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   She was offered anticholinergics or Beta 3 Agonist at last visit in 05/2021 for her urinary urgency, which she declined.  She reports today that she has had some urgency frequency and occasional urge incontinence. She also feels pressure in vaginal area.   Vitals:   06/12/22 0935  BP: 118/75  Pulse: 68   NED. A&Ox3.   No respiratory distress   Abd soft, NT, ND Normal external genitalia with patent urethral meatus  Cystoscopy Procedure Note  Patient identification was confirmed, informed consent was obtained, and patient was prepped using Betadine solution.  Lidocaine jelly was administered per urethral meatus.    Procedure: - Flexible cystoscope introduced, without any difficulty.   - Thorough search of the bladder revealed:    normal urethral meatus    normal urothelium    no stones    no ulcers     no tumors    no urethral polyps    no trabeculation  - Ureteral orifices were normal in position and appearance.  Post-Procedure: - Patient tolerated the procedure well  Pelvic exam: Mild prolapse  Assessment/ Plan:  1. History of bladder cancer -Personal history of low risk bladder cancer. - No evidence of disease today.   2. Vaginal prolapse -mild, recommend pelvic floor exercises     Return in about 1 year (around  06/13/2023) for 1 year cysto.  Conley Rolls as a Education administrator for Kelly Espy, MD.,have documented all relevant documentation on the behalf of Kelly Espy, MD,as directed by  Kelly Espy, MD while in the presence of Kelly Espy, MD.  I have reviewed the above documentation for accuracy and completeness, and I agree with the above.   Kelly Espy, MD

## 2022-06-12 NOTE — Progress Notes (Signed)
In and Out Catheterization  Patient is present today for a I & O catheterization. Patient was cleaned and prepped in a sterile fashion with betadine . A 14FR cath was inserted no complications were noted , 64m of urine return was noted, urine was yellow in color. A clean urine sample was collected for UA. Bladder was drained  And catheter was removed with out difficulty.    Performed by: SFonnie Jarvis CMA

## 2022-07-05 IMAGING — MG MM DIGITAL SCREENING BILAT W/ TOMO AND CAD
6 of 10 series · 6 of 30 positions shown · non-contrast
Comparison: Previous exam(s).

CLINICAL DATA: Screening.

EXAM:
DIGITAL SCREENING BILATERAL MAMMOGRAM WITH TOMOSYNTHESIS AND CAD
TECHNIQUE: Bilateral screening digital craniocaudal and mediolateral oblique
mammograms were obtained. Bilateral screening digital breast
tomosynthesis was performed. The images were evaluated with
computer-aided detection.

[R MLO synth-2D (1 of 2)]
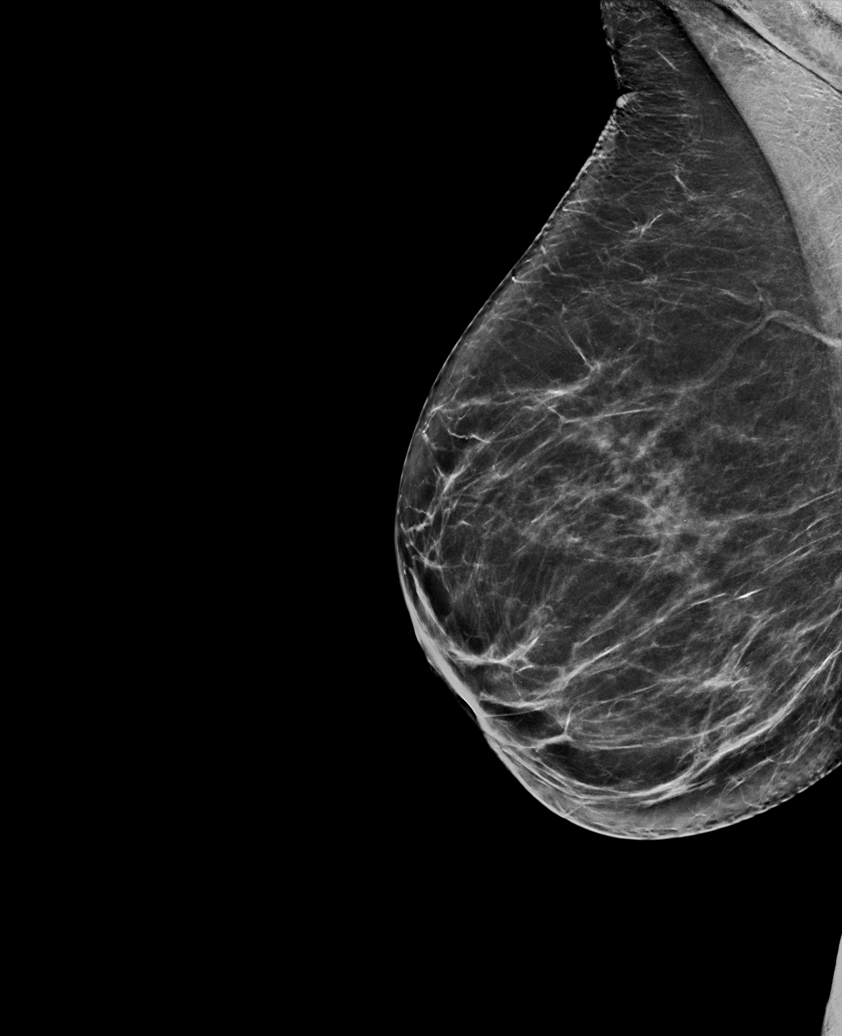

[L CC synth-2D]
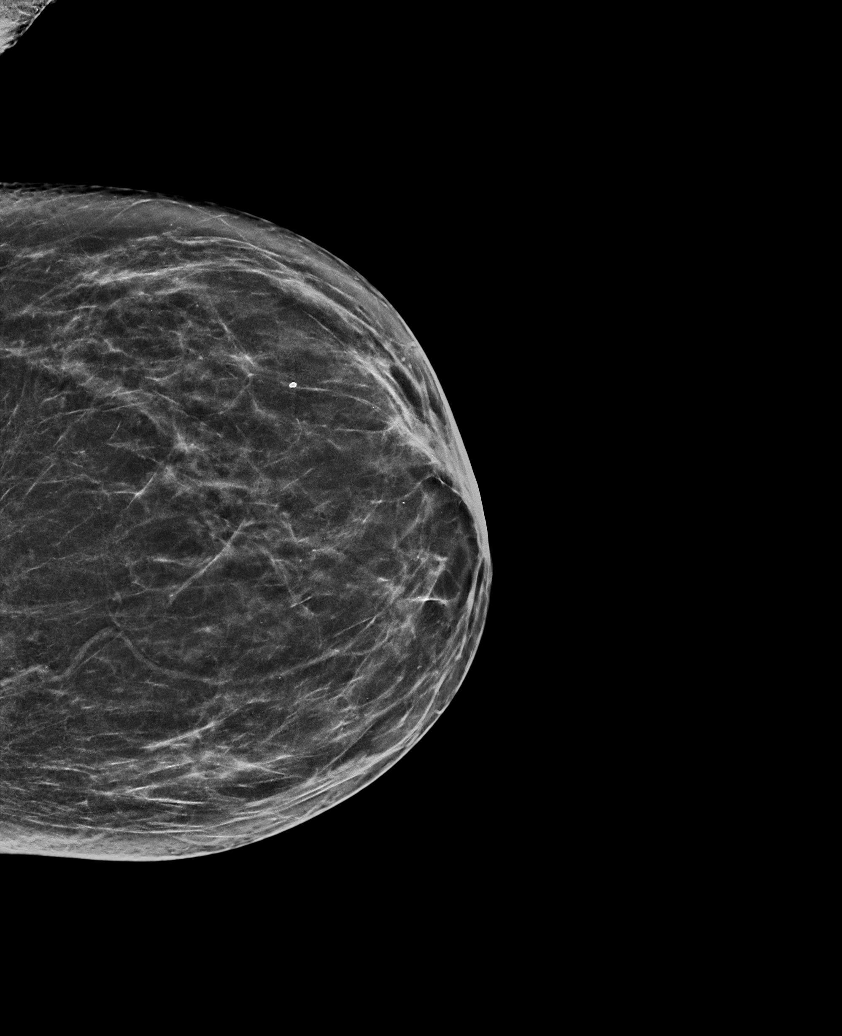

[R CC synth-2D]
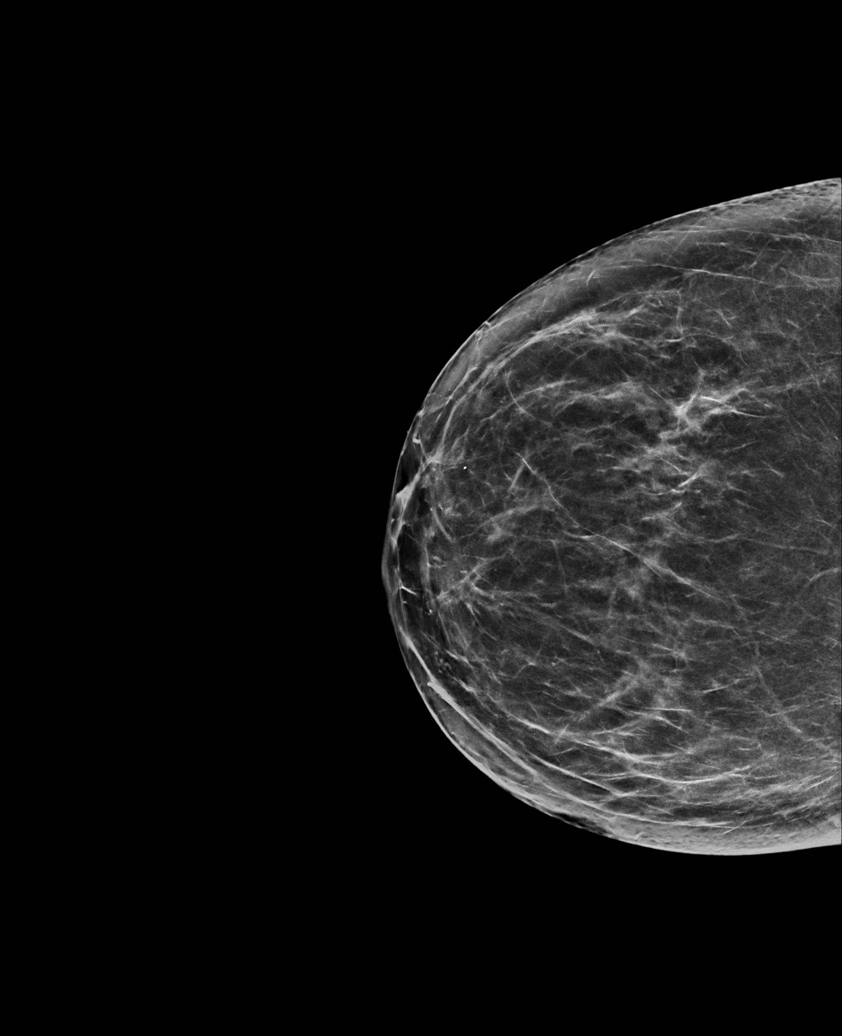

[R MLO synth-2D (2 of 2)]
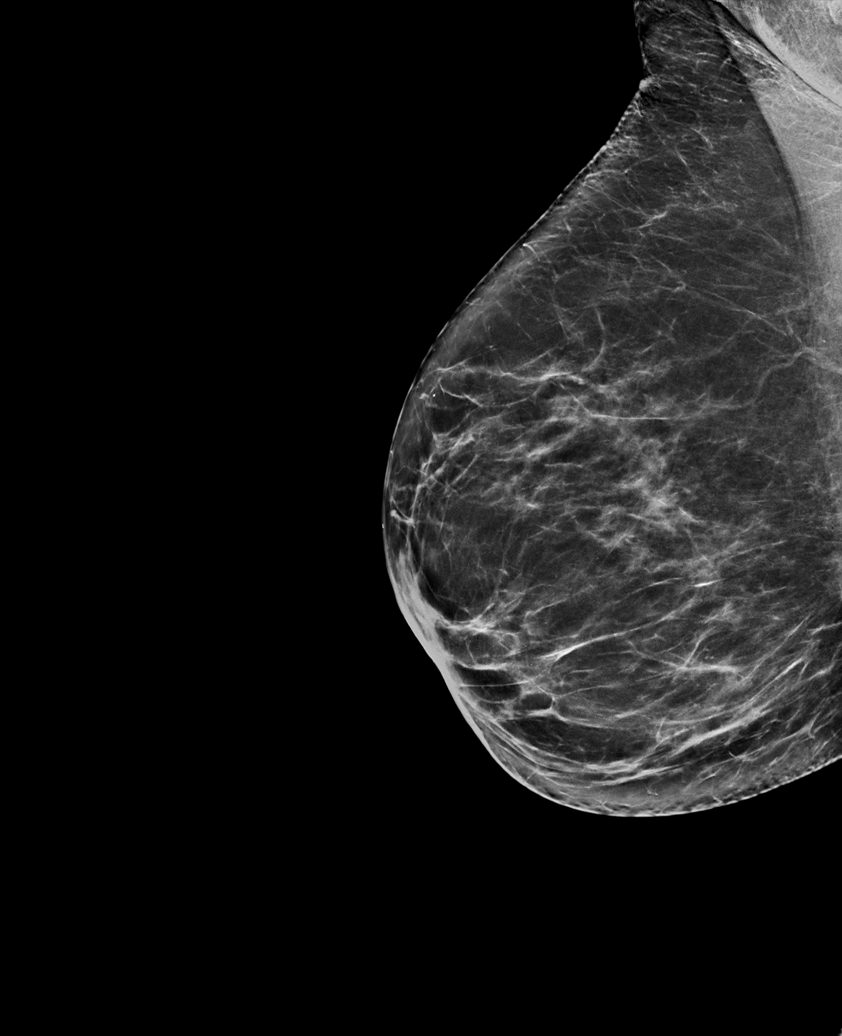

[L MLO synth-2D]
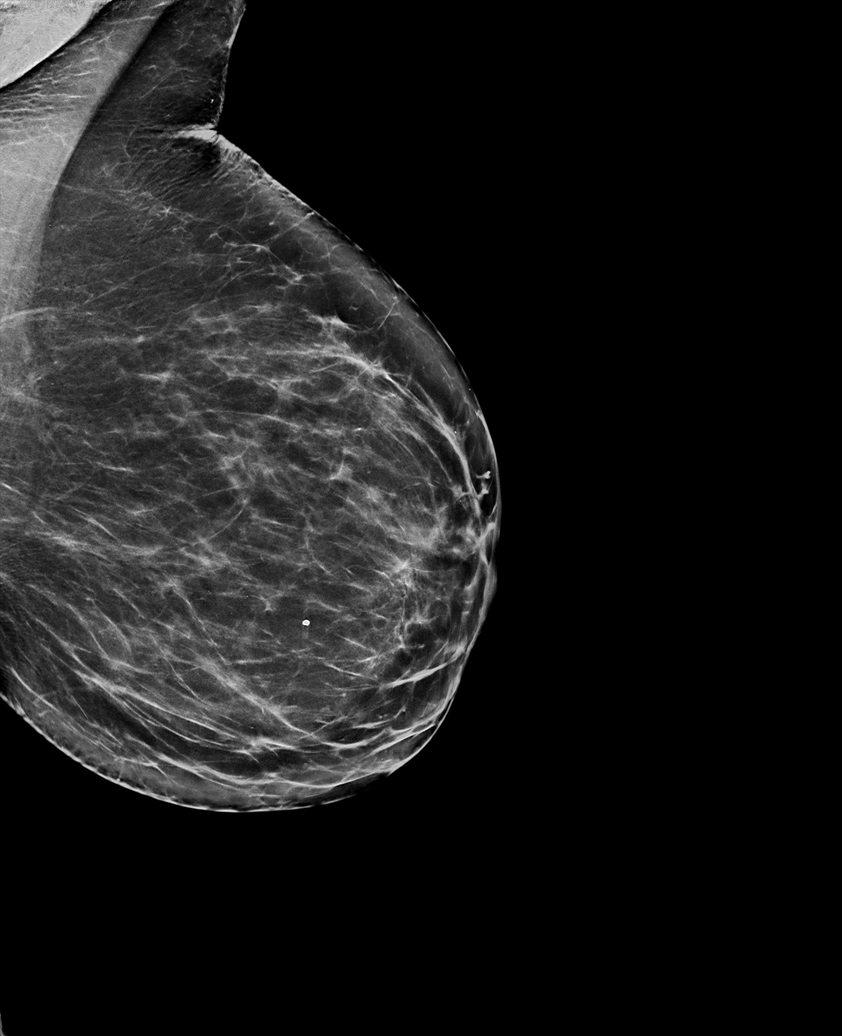

[R MLO tomo · tomo slice 38/75.0]
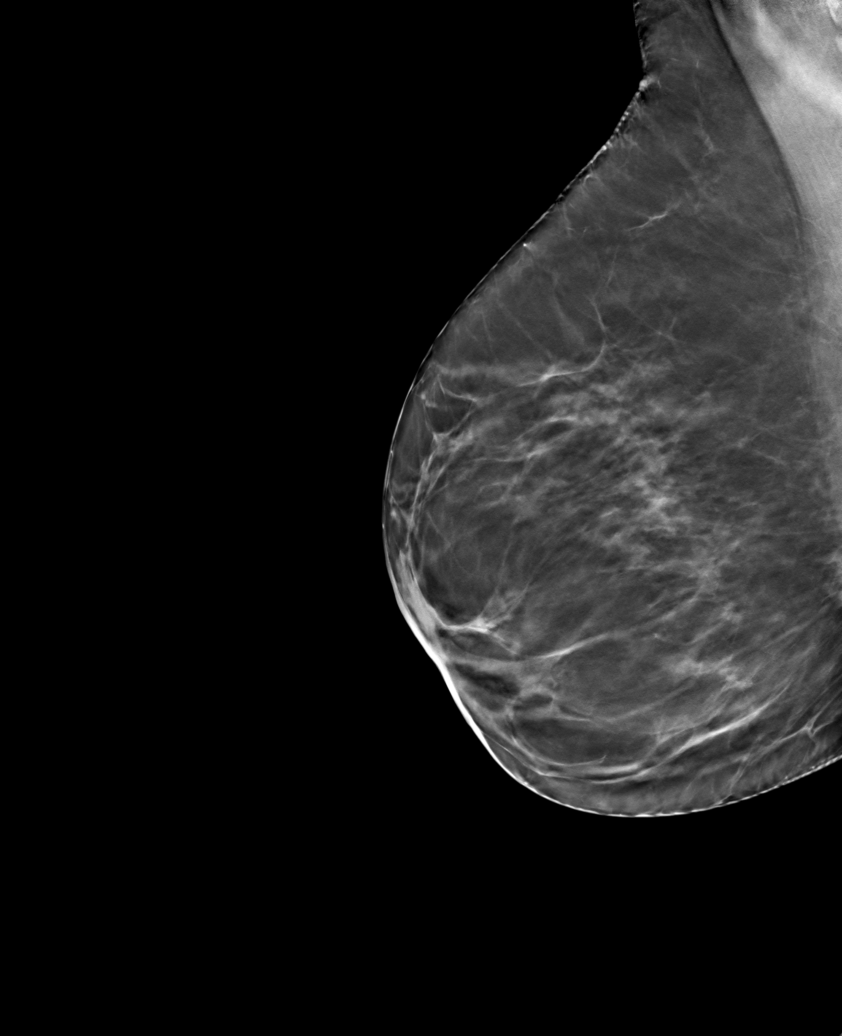

[6 of 30 positions shown; findings below may reference images not displayed]

ACR Breast Density Category b: There are scattered areas of
fibroglandular density.
FINDINGS: There are no findings suspicious for malignancy.
IMPRESSION: No mammographic evidence of malignancy. A result letter of this
screening mammogram will be mailed directly to the patient.

RECOMMENDATION:
Screening mammogram in one year. (Code:51-O-LD2)

BI-RADS CATEGORY  1: Negative.

## 2022-09-10 ENCOUNTER — Emergency Department: Payer: Medicare PPO

## 2022-09-10 ENCOUNTER — Other Ambulatory Visit: Payer: Self-pay

## 2022-09-10 ENCOUNTER — Emergency Department
Admission: EM | Admit: 2022-09-10 | Discharge: 2022-09-10 | Disposition: A | Discharge status: Home / Self Care | Payer: Medicare PPO | Attending: Emergency Medicine | Admitting: Emergency Medicine

## 2022-09-10 ENCOUNTER — Encounter: Payer: Self-pay | Admitting: Emergency Medicine

## 2022-09-10 DIAGNOSIS — G43809 Other migraine, not intractable, without status migrainosus: Secondary | ICD-10-CM | POA: Insufficient documentation

## 2022-09-10 DIAGNOSIS — R799 Abnormal finding of blood chemistry, unspecified: Secondary | ICD-10-CM | POA: Diagnosis not present

## 2022-09-10 DIAGNOSIS — Z85828 Personal history of other malignant neoplasm of skin: Secondary | ICD-10-CM | POA: Diagnosis not present

## 2022-09-10 DIAGNOSIS — R791 Abnormal coagulation profile: Secondary | ICD-10-CM | POA: Diagnosis not present

## 2022-09-10 DIAGNOSIS — I10 Essential (primary) hypertension: Secondary | ICD-10-CM | POA: Diagnosis not present

## 2022-09-10 DIAGNOSIS — R519 Headache, unspecified: Secondary | ICD-10-CM | POA: Diagnosis present

## 2022-09-10 LAB — CBC
HCT: 41.2 % (ref 36.0–46.0)
Hemoglobin: 14.2 g/dL (ref 12.0–15.0)
MCH: 31.4 pg (ref 26.0–34.0)
MCHC: 34.5 g/dL (ref 30.0–36.0)
MCV: 91.2 fL (ref 80.0–100.0)
Platelets: 197 10*3/uL (ref 150–400)
RBC: 4.52 MIL/uL (ref 3.87–5.11)
RDW: 12.6 % (ref 11.5–15.5)
WBC: 14.5 10*3/uL — ABNORMAL HIGH (ref 4.0–10.5)
nRBC: 0 % (ref 0.0–0.2)

## 2022-09-10 LAB — DIFFERENTIAL
Abs Immature Granulocytes: 0.13 10*3/uL — ABNORMAL HIGH (ref 0.00–0.07)
Basophils Absolute: 0 10*3/uL (ref 0.0–0.1)
Basophils Relative: 0 %
Eosinophils Absolute: 0 10*3/uL (ref 0.0–0.5)
Eosinophils Relative: 0 %
Immature Granulocytes: 1 %
Lymphocytes Relative: 10 %
Lymphs Abs: 1.4 10*3/uL (ref 0.7–4.0)
Monocytes Absolute: 0.8 10*3/uL (ref 0.1–1.0)
Monocytes Relative: 5 %
Neutro Abs: 12.2 10*3/uL — ABNORMAL HIGH (ref 1.7–7.7)
Neutrophils Relative %: 84 %

## 2022-09-10 LAB — CBG MONITORING, ED: Glucose-Capillary: 131 mg/dL — ABNORMAL HIGH (ref 70–99)

## 2022-09-10 LAB — COMPREHENSIVE METABOLIC PANEL
ALT: 23 U/L (ref 0–44)
AST: 29 U/L (ref 15–41)
Albumin: 3.6 g/dL (ref 3.5–5.0)
Alkaline Phosphatase: 76 U/L (ref 38–126)
Anion gap: 9 (ref 5–15)
BUN: 17 mg/dL (ref 8–23)
CO2: 27 mmol/L (ref 22–32)
Calcium: 9.6 mg/dL (ref 8.9–10.3)
Chloride: 104 mmol/L (ref 98–111)
Creatinine, Ser: 0.81 mg/dL (ref 0.44–1.00)
GFR, Estimated: >60 mL/min (ref >60)
Glucose, Bld: 134 mg/dL — ABNORMAL HIGH (ref 70–99)
Potassium: 3.9 mmol/L (ref 3.5–5.1)
Sodium: 140 mmol/L (ref 135–145)
Total Bilirubin: 0.8 mg/dL (ref 0.3–1.2)
Total Protein: 7 g/dL (ref 6.5–8.1)

## 2022-09-10 LAB — PROTIME-INR
INR: 1.1 (ref 0.8–1.2)
Prothrombin Time: 13.7 seconds (ref 11.4–15.2)

## 2022-09-10 LAB — APTT: aPTT: 28 seconds (ref 24–36)

## 2022-09-10 LAB — ETHANOL: Alcohol, Ethyl (B): <10 mg/dL (ref <10)

## 2022-09-10 MED ORDER — DIPHENHYDRAMINE HCL 50 MG/ML IJ SOLN
50.0000 mg | Freq: Once | INTRAMUSCULAR | Status: AC
  Administered 2022-09-10: 50 mg via INTRAVENOUS
  Filled 2022-09-10: qty 1

## 2022-09-10 MED ORDER — SODIUM CHLORIDE 0.9 % IV BOLUS
1000.0000 mL | Freq: Once | INTRAVENOUS | Status: AC
  Administered 2022-09-10: 1000 mL via INTRAVENOUS

## 2022-09-10 MED ORDER — KETOROLAC TROMETHAMINE 30 MG/ML IJ SOLN
15.0000 mg | Freq: Once | INTRAMUSCULAR | Status: AC
  Administered 2022-09-10: 15 mg via INTRAVENOUS
  Filled 2022-09-10: qty 1

## 2022-09-10 MED ORDER — METOCLOPRAMIDE HCL 5 MG/ML IJ SOLN
10.0000 mg | Freq: Once | INTRAMUSCULAR | Status: DC
  Filled 2022-09-10: qty 2

## 2022-09-10 MED ORDER — TETRACAINE HCL 0.5 % OP SOLN
1.0000 [drp] | Freq: Once | OPHTHALMIC | Status: AC
  Administered 2022-09-10: 1 [drp] via OPHTHALMIC
  Filled 2022-09-10: qty 4

## 2022-09-10 MED ORDER — METOCLOPRAMIDE HCL 5 MG/ML IJ SOLN
10.0000 mg | Freq: Once | INTRAMUSCULAR | Status: AC
  Administered 2022-09-10: 10 mg via INTRAVENOUS

## 2022-09-10 MED ORDER — SODIUM CHLORIDE 0.9 % IV BOLUS: 1000.0000 mL | Freq: Once | INTRAVENOUS | Status: DC

## 2022-09-10 NOTE — ED Provider Notes (Signed)
[facility] Provider Note    Event Date/Time   First MD Initiated Contact with Patient 09/10/22 1014     (approximate)   History   Eye Pain   HPI  Kelly Merritt is a 76 y.o. female with a past medical history of hypertension, hypercholesterolemia, tobacco abuse who presents today for evaluation of left-sided headache and visual changes.  Patient reports that this began at 8:30 AM this morning.  She reports that it feels like her vision is "wonky" that.  She reports that she does have a history of migraines but has not had one in many years.  She does not wear contact lenses or glasses.  She denies any numbness or weakness in any of her extremities.  She has not had any slurred speech or confusion.  Family with her reports that she has been otherwise acting her normal self with the exception of her pain.  Patient also reports that she has significant light sensitivity.  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 --      Excl. in [place]? --     Most recent vital signs: Vitals:   09/10/22 0940 09/10/22 1243  BP: 117/70 116/72  Pulse: 66 66  Resp: 16 16  Temp: 97.8 F (36.6 C) 98 F (36.7 C)  SpO2: 92% 94%    Physical Exam Vitals and  nursing note reviewed.  Constitutional:      General: Awake and alert. No acute distress.    Appearance: Normal appearance. The patient is normal weight.  HENT:     Head: Normocephalic and atraumatic.     Mouth: Mucous membranes are moist.  Eyes:     General: PERRL. Normal EOMs, no palsy noted.  Normal visual fields.  Ocular pressure 21.  Reports double vision when both eyes are open, when one eye covered to double vision resolves        Right eye: No discharge.        Left eye: No discharge.     Conjunctiva/sclera: Conjunctivae normal.  Cardiovascular:     Rate and Rhythm: Normal rate and regular rhythm.     Pulses: Normal pulses.     Heart sounds: Normal heart sounds Pulmonary:     Effort: Pulmonary effort is normal. No respiratory distress.     Breath sounds: Normal breath sounds.  Abdominal:     Abdomen is soft. There is no abdominal tenderness. No rebound  or guarding. No distention. Musculoskeletal:        General: No swelling. Normal range of motion.     Cervical back: Normal range of motion and neck supple.  Skin:    General: Skin is warm and dry.     Capillary Refill: Capillary refill takes less than 2 seconds.     Findings: No rash.  Neurological:     Mental Status: The patient is awake and alert.   Neurological: GCS 15 alert and oriented x3 Normal speech, no expressive or receptive aphasia or dysarthria Cranial nerves II through XII intact Normal visual fields 5 out of 5 strength in all 4 extremities with intact sensation throughout No extremity drift Normal finger-to-nose testing, no limb or truncal ataxia   ED Results / Procedures / Treatments   Labs (all labs ordered are listed, but only abnormal results are displayed) Labs Reviewed  CBC - Abnormal; Notable for the following components:      Result Value   WBC 14.5 (*)    All other components within normal limits  DIFFERENTIAL - Abnormal; Notable for the following components:   Neutro Abs 12.2 (*)     Abs Immature Granulocytes 0.13 (*)    All other components within normal limits  COMPREHENSIVE METABOLIC PANEL - Abnormal; Notable for the following components:   Glucose, Bld 134 (*)    All other components within normal limits  CBG MONITORING, ED - Abnormal; Notable for the following components:   Glucose-Capillary 131 (*)    All other components within normal limits  PROTIME-INR  APTT  ETHANOL  URINE DRUG SCREEN, QUALITATIVE ([facility] ONLY)  URINALYSIS, ROUTINE W REFLEX MICROSCOPIC     EKG     RADIOLOGY I independently reviewed and interpreted imaging and agree with radiologists findings.     PROCEDURES:  Critical Care performed:   Procedures   MEDICATIONS ORDERED IN ED: Medications  metoCLOPramide (REGLAN) injection 10 mg (10 mg Intravenous Given 09/10/22 1112)  sodium chloride 0.9 % bolus 1,000 mL (0 mLs Intravenous Stopped 09/10/22 1246)  ketorolac (TORADOL) 30 MG/ML injection 15 mg (15 mg Intravenous Given 09/10/22 1111)  diphenhydrAMINE (BENADRYL) injection 50 mg (50 mg Intravenous Given 09/10/22 1111)  tetracaine (PONTOCAINE) 0.5 % ophthalmic solution 1 drop (1 drop Left Eye Given by Other 09/10/22 1124)     IMPRESSION / MDM / [place] / ED COURSE  I reviewed the triage vital signs and the nursing notes.   Differential diagnosis includes, but is not limited to, complex migraine, TIA/CVA, acute angle-closure glaucoma.  Patient is awake and alert, appears to be uncomfortable and the lights are dimmed in the room.  Pupils equal, round, reactive to light.  Given her double vision and acute onsets of her symptoms with her headache and given that she is within the TNK window, I discussed with Dr. Kerman Passey who has activated a stroke code.  Patient was evaluated by the neurologist who feels that this is more consistent with complex migraine.  Stroke code was canceled.  Ocular pressure of her left eye is 21, not consistent with acute angle-closure glaucoma.   No nuchal rigidity or fever.  Patient was treated with a migraine cocktail with resolution of her headache and visual changes.  Recommended outpatient follow-up and return precautions.  Patient understands and agrees with plan.  She was discharged in stable condition.  Patient was also seen and evaluated by Dr. Kerman Passey who agrees with assessment and plan.   Patient's presentation is most consistent with  acute presentation with potential threat to life or bodily function.   Clinical Course as of 09/10/22 1322  Thu Sep 10, 2022  1133 Headache has resolved [JP]    Clinical Course User Index [JP] Valisa Karpel, Clarnce Flock, PA-C     FINAL CLINICAL IMPRESSION(S) / ED DIAGNOSES   Final diagnoses:  Other migraine without status migrainosus, not intractable     Rx / DC Orders   ED Discharge Orders     None        Note:  This document was prepared using Dragon voice recognition software and may include unintentional dictation errors.   Marquette Old, PA-C 09/10/22 1322    Harvest Dark, MD 09/10/22 1355

## 2022-09-10 NOTE — ED Provider Notes (Signed)
-----------------------------------------   10:37 AM on 09/10/2022 ----------------------------------------- Patient seen in conjunction with physician assistant Sheran Luz.  Patient states acute onset at 830 this morning of double vision.  States she has never had double vision previously.  Patient states she woke this morning earlier with normal vision until 8:30 AM when she had acute onset of double vision.  Patient also states around that time she developed left-sided headache and does have a history of migraines although states it has been years since she had a migraine last.  Patient denies any weakness or numbness of any arm or leg denies any confusion or slurred speech.  During my evaluation patient has good grip strength bilaterally 5/5 motor in all extremities with no pronator drift.  No objective cranial nerve deficits.  Extraocular muscles intact no palsies noted.  Patient subjectively does state double vision in all fields with both eyes however states the double vision resolves when covering either of her eyes.  Given the acute onset of symptoms within the TNK window I have activated a code stroke.  We will have neurology evaluate we will obtain labs and CT imaging and continue to closely monitor.  Symptoms very likely could be related to a complicated migraine given the patient's left-sided headache, but as this has never occurred previously I believe we need to rule out stroke as well.   Harvest Dark, MD 09/10/22 1353

## 2022-09-10 NOTE — ED Triage Notes (Signed)
Pt states coming in with left eye pain that started at 8am. Pt denies numbness, tingling, dizziness or any other concerns. Pt states certain light makes the pain worse.   Pt states being seen at urgent care yesterday and was diagnosed with bronchitis and started on several new medications. Pt states loss of husband last week and the funeral was yesterday.  Pt states normal oxygen is 93%

## 2022-09-10 NOTE — ED Notes (Signed)
Called [facility]link activated  code stroke to [place]

## 2022-09-10 NOTE — Discharge Instructions (Signed)
Your CT scan and blood test were normal.  Please return for any new, worsening, or change in symptoms or other concerns.  It was a pleasure caring for you today.

## 2022-09-10 NOTE — Code Documentation (Signed)
Stroke Response Nurse Documentation Code Documentation  Kelly Merritt is a 76 y.o. female arriving to [facility] via [employer] on 09/10/2022 with past medical hx of migraine headaches, gastric ulcer, mitral valve disorders,  hypercholesterolemia, HTN, heart murmur, squamous cell carcinoma, tobacco use. On aspirin 81 mg daily. Code stroke was activated by ED.   Patient from home where she was LKW at 0830 and now complaining of left sided eye pain/headache, blurry vision. Patient reports being at home with family sitting on porch when she had an acute onset of blurry vision, left sided eye pain, and left sided headache.  Stroke team at the bedside after Code Stroke Activation. Labs drawn and patient cleared for CT by Dr. Jeanella Cara. Patient to CT with team. NIHSS 1, see documentation for details and code stroke times. Patient with left decreased sensation on exam. The following imaging was completed:  CT Head. Patient is not a candidate for IV Thrombolytic due to stroke not suspected, per MD. Patient is not a candidate for IR due to LVO not suspected, per MD.   Care Plan: Code Stroke cancelled at 1100.  Bedside handoff with ED RN Erlene Quan.    Charise Carwin  Stroke Response RN

## 2022-09-10 NOTE — ED Notes (Signed)
See triage note  Presents with left eye pain  States pain started about 8 am  Light makes it worse  No weakness or tingling

## 2023-02-01 ENCOUNTER — Other Ambulatory Visit: Payer: Self-pay

## 2023-02-01 DIAGNOSIS — Z1231 Encounter for screening mammogram for malignant neoplasm of breast: Secondary | ICD-10-CM

## 2023-02-18 ENCOUNTER — Ambulatory Visit
Admission: RE | Admit: 2023-02-18 | Discharge: 2023-02-18 | Disposition: A | Discharge status: Home / Self Care | Payer: Medicare HMO | Source: Ambulatory Visit | Attending: Family Medicine | Admitting: Family Medicine

## 2023-02-18 DIAGNOSIS — Z1231 Encounter for screening mammogram for malignant neoplasm of breast: Secondary | ICD-10-CM | POA: Diagnosis not present

## 2023-03-23 DIAGNOSIS — J431 Panlobular emphysema: Secondary | ICD-10-CM | POA: Insufficient documentation

## 2023-06-11 ENCOUNTER — Other Ambulatory Visit: Payer: Medicare HMO | Admitting: Urology

## 2023-06-24 ENCOUNTER — Other Ambulatory Visit: Payer: Self-pay

## 2023-06-24 DIAGNOSIS — Z8551 Personal history of malignant neoplasm of bladder: Secondary | ICD-10-CM

## 2023-06-24 DIAGNOSIS — R300 Dysuria: Secondary | ICD-10-CM

## 2023-06-25 ENCOUNTER — Other Ambulatory Visit
Admission: RE | Admit: 2023-06-25 | Discharge: 2023-06-25 | Disposition: A | Discharge status: Home / Self Care | Payer: Medicare HMO | Attending: Urology | Admitting: Urology

## 2023-06-25 ENCOUNTER — Ambulatory Visit (INDEPENDENT_AMBULATORY_CARE_PROVIDER_SITE_OTHER): Payer: Medicare HMO | Admitting: Urology

## 2023-06-25 VITALS — BP 142/87 | HR 70 | Ht 64.0 in | Wt 156.0 lb

## 2023-06-25 DIAGNOSIS — R300 Dysuria: Secondary | ICD-10-CM | POA: Diagnosis present

## 2023-06-25 DIAGNOSIS — Z8551 Personal history of malignant neoplasm of bladder: Secondary | ICD-10-CM | POA: Insufficient documentation

## 2023-06-25 DIAGNOSIS — Z720 Tobacco use: Secondary | ICD-10-CM

## 2023-06-25 DIAGNOSIS — Z08 Encounter for follow-up examination after completed treatment for malignant neoplasm: Secondary | ICD-10-CM | POA: Diagnosis not present

## 2023-06-25 LAB — URINALYSIS, COMPLETE (UACMP) WITH MICROSCOPIC
Bilirubin Urine: NEGATIVE
Glucose, UA: NEGATIVE mg/dL
Hgb urine dipstick: NEGATIVE
Ketones, ur: NEGATIVE mg/dL
Leukocytes,Ua: NEGATIVE
Nitrite: NEGATIVE
Protein, ur: NEGATIVE mg/dL
Specific Gravity, Urine: >1.03 — ABNORMAL HIGH (ref 1.005–1.030)
pH: 5.5 (ref 5.0–8.0)

## 2023-06-25 NOTE — Progress Notes (Signed)
    06/25/23  CC:  Chief Complaint  Patient presents with   Cysto     HPI: Kelly Merritt is a 77 y.o.female  with a personal history of bladder cancer and urinary urgency returns today for surveillance cystoscopy.    She was found to have a papillary tumor located near the left hemitrigone on the left lateral wall measuring 1.5 cm.  She was taken to the operating room on 08/03/2018 for TURBT, B RTG, and mitomycin.     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  She has had a tough year.  She has lost her husband.  She continues to smoke.      NED. A&Ox3.   No respiratory distress   Abd soft, NT, ND Normal external genitalia with patent urethral meatus  Cystoscopy Procedure Note  Patient identification was confirmed, informed consent was obtained, and patient was prepped using Betadine solution.  Lidocaine jelly was administered per urethral meatus.    Procedure: - Flexible cystoscope introduced, without any difficulty.   - Thorough search of the bladder revealed:    normal urethral meatus    normal urothelium    no stones    no ulcers     no tumors    no urethral polyps    no trabeculation  - Ureteral orifices were normal in position and appearance.  Post-Procedure: - Patient tolerated the procedure well  Pelvic exam: Mild prolapse  Assessment/ Plan:  1. History of bladder cancer -Personal history of low risk bladder cancer. - No evidence of disease today.   2. Smoker -Continue to recommend smoking cessation, patient aware of causative relationship to bladder cancer and smoking    F/u cysto in 1 year    Vanna Scotland, MD

## 2024-01-05 ENCOUNTER — Other Ambulatory Visit: Payer: Self-pay | Admitting: Family Medicine

## 2024-01-05 DIAGNOSIS — Z1231 Encounter for screening mammogram for malignant neoplasm of breast: Secondary | ICD-10-CM

## 2024-02-16 ENCOUNTER — Encounter: Payer: Self-pay | Admitting: Urology

## 2024-02-22 ENCOUNTER — Ambulatory Visit
Admission: RE | Admit: 2024-02-22 | Discharge: 2024-02-22 | Disposition: A | Discharge status: Home / Self Care | Source: Ambulatory Visit | Attending: Family Medicine | Admitting: Family Medicine

## 2024-02-22 DIAGNOSIS — Z1231 Encounter for screening mammogram for malignant neoplasm of breast: Secondary | ICD-10-CM | POA: Diagnosis present

## 2024-04-17 ENCOUNTER — Other Ambulatory Visit: Payer: Self-pay

## 2024-04-17 DIAGNOSIS — R300 Dysuria: Secondary | ICD-10-CM

## 2024-04-17 DIAGNOSIS — Z8551 Personal history of malignant neoplasm of bladder: Secondary | ICD-10-CM

## 2024-04-21 ENCOUNTER — Ambulatory Visit: Admitting: Urology

## 2024-04-21 ENCOUNTER — Other Ambulatory Visit
Admission: RE | Admit: 2024-04-21 | Discharge: 2024-04-21 | Disposition: A | Discharge status: Home / Self Care | Attending: Urology | Admitting: Urology

## 2024-04-21 VITALS — BP 127/79 | HR 86 | Ht 64.0 in | Wt 164.0 lb

## 2024-04-21 DIAGNOSIS — Z8551 Personal history of malignant neoplasm of bladder: Secondary | ICD-10-CM

## 2024-04-21 DIAGNOSIS — R300 Dysuria: Secondary | ICD-10-CM | POA: Diagnosis present

## 2024-04-21 DIAGNOSIS — J449 Chronic obstructive pulmonary disease, unspecified: Secondary | ICD-10-CM | POA: Insufficient documentation

## 2024-04-21 DIAGNOSIS — R3915 Urgency of urination: Secondary | ICD-10-CM

## 2024-04-21 LAB — URINALYSIS, COMPLETE (UACMP) WITH MICROSCOPIC
Bilirubin Urine: NEGATIVE
Glucose, UA: NEGATIVE mg/dL
Hgb urine dipstick: NEGATIVE
Ketones, ur: NEGATIVE mg/dL
Nitrite: NEGATIVE
Protein, ur: NEGATIVE mg/dL
RBC / HPF: NONE SEEN RBC/hpf (ref 0–5)
Specific Gravity, Urine: 1.02 (ref 1.005–1.030)
pH: 5.5 (ref 5.0–8.0)

## 2024-04-21 NOTE — Progress Notes (Signed)
    04/21/24  CC:  Chief Complaint  Patient presents with   Cysto     HPI: Kelly Merritt is a 78 y.o.female  with a personal history of bladder cancer and urinary urgency returns today for surveillance cystoscopy.    She was found to have a papillary tumor located near the left hemitrigone on the left lateral wall measuring 1.5 cm.  She was taken to the operating room on 08/03/2018 for TURBT, B RTG, and mitomycin .     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  She has no voiding complaints today.  She did stop smoking 6 months ago for which I congratulated her.  Notably, she was found to have a growing pulmonary nodule and she is scheduled for a PET scan.  She is very worried about this.    NED. A&Ox3.   No respiratory distress   Abd soft, NT, ND Normal external genitalia with patent urethral meatus  Cystoscopy Procedure Note  Patient identification was confirmed, informed consent was obtained, and patient was prepped using Betadine solution.  Lidocaine  jelly was administered per urethral meatus.    Procedure: - Flexible cystoscope introduced, without any difficulty.   - Thorough search of the bladder revealed:    normal urethral meatus    normal urothelium    no stones    no ulcers     no tumors    no urethral polyps    no trabeculation  - Ureteral orifices were normal in position and appearance.  Post-Procedure: - Patient tolerated the procedure well  Pelvic exam: Mild prolapse  Assessment/ Plan:  1. History of bladder cancer -Personal history of low risk bladder cancer. - No evidence of disease today.   We had a lengthy discussion today, shared decision making about whether or not to continue annual surveillance cystoscopy.  She does have a profound smoking history which does increase her  risk but is had no evidence of recurrence in greater than 5 years.  I would recommend based on this, she continue annual surveillance cystoscopy but also shared with her the American urologic guidelines.  At this point in time, she would like to hold off on scheduling her cystoscopy next year.  She will likely proceed with that if she is doing well but has some uncertainty as it relates to her possible new oncologic diagnosis.  F/u TBD based on patient preference   Dustin Gimenez, MD

## 2024-06-14 ENCOUNTER — Other Ambulatory Visit: Admitting: Urology

## 2024-06-16 ENCOUNTER — Other Ambulatory Visit: Payer: Self-pay | Admitting: Urology
